# Patient Record
Sex: Male | Born: 1945 | Race: White | Hispanic: No | Marital: Married | Smoking: Never smoker
Health system: Southern US, Community
[De-identification: ages and names within clinical notes are randomized; demographics above are authoritative.]

## PROBLEM LIST (undated history)

## (undated) DIAGNOSIS — K635 Polyp of colon: Secondary | ICD-10-CM

## (undated) DIAGNOSIS — T7840XA Allergy, unspecified, initial encounter: Secondary | ICD-10-CM

## (undated) DIAGNOSIS — F419 Anxiety disorder, unspecified: Secondary | ICD-10-CM

## (undated) DIAGNOSIS — K5790 Diverticulosis of intestine, part unspecified, without perforation or abscess without bleeding: Secondary | ICD-10-CM

## (undated) DIAGNOSIS — K222 Esophageal obstruction: Secondary | ICD-10-CM

## (undated) DIAGNOSIS — K219 Gastro-esophageal reflux disease without esophagitis: Secondary | ICD-10-CM

## (undated) DIAGNOSIS — E785 Hyperlipidemia, unspecified: Secondary | ICD-10-CM

## (undated) DIAGNOSIS — K2210 Ulcer of esophagus without bleeding: Secondary | ICD-10-CM

## (undated) DIAGNOSIS — K589 Irritable bowel syndrome without diarrhea: Secondary | ICD-10-CM

## (undated) HISTORY — DX: Ulcer of esophagus without bleeding: K22.10

## (undated) HISTORY — DX: Anxiety disorder, unspecified: F41.9

## (undated) HISTORY — DX: Polyp of colon: K63.5

## (undated) HISTORY — DX: Gastro-esophageal reflux disease without esophagitis: K21.9

## (undated) HISTORY — PX: HERNIA REPAIR: SHX51

## (undated) HISTORY — DX: Diverticulosis of intestine, part unspecified, without perforation or abscess without bleeding: K57.90

## (undated) HISTORY — DX: Allergy, unspecified, initial encounter: T78.40XA

## (undated) HISTORY — DX: Hyperlipidemia, unspecified: E78.5

## (undated) HISTORY — DX: Esophageal obstruction: K22.2

## (undated) HISTORY — DX: Irritable bowel syndrome, unspecified: K58.9

---
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
## 1994-11-19 ENCOUNTER — Encounter: Payer: Self-pay | Admitting: Internal Medicine

## 1994-12-14 ENCOUNTER — Encounter: Payer: Self-pay | Admitting: Internal Medicine

## 1995-02-08 ENCOUNTER — Encounter: Payer: Self-pay | Admitting: Internal Medicine

## 1995-07-14 ENCOUNTER — Encounter: Payer: Self-pay | Admitting: Internal Medicine

## 1999-06-17 ENCOUNTER — Encounter: Payer: Self-pay | Admitting: Internal Medicine

## 1999-07-07 ENCOUNTER — Encounter: Payer: Self-pay | Admitting: Internal Medicine

## 1999-07-07 ENCOUNTER — Other Ambulatory Visit: Admission: RE | Admit: 1999-07-07 | Discharge: 1999-07-07 | Payer: Self-pay | Admitting: Internal Medicine

## 2001-03-10 ENCOUNTER — Emergency Department (HOSPITAL_COMMUNITY): Admission: EM | Admit: 2001-03-10 | Discharge: 2001-03-10 | Payer: Self-pay | Admitting: Emergency Medicine

## 2001-03-10 ENCOUNTER — Encounter: Payer: Self-pay | Admitting: Emergency Medicine

## 2002-07-17 ENCOUNTER — Encounter: Payer: Self-pay | Admitting: Internal Medicine

## 2003-12-28 ENCOUNTER — Ambulatory Visit: Payer: Self-pay | Admitting: Family Medicine

## 2004-01-04 ENCOUNTER — Ambulatory Visit: Payer: Self-pay | Admitting: Family Medicine

## 2004-12-19 ENCOUNTER — Ambulatory Visit: Payer: Self-pay | Admitting: Family Medicine

## 2004-12-26 ENCOUNTER — Ambulatory Visit: Payer: Self-pay | Admitting: Family Medicine

## 2005-07-13 ENCOUNTER — Ambulatory Visit: Payer: Self-pay | Admitting: Family Medicine

## 2005-12-25 ENCOUNTER — Ambulatory Visit: Payer: Self-pay | Admitting: Family Medicine

## 2005-12-25 LAB — CONVERTED CEMR LAB
ALT: 32 units/L (ref 0–40)
AST: 30 units/L (ref 0–37)
Albumin: 4.2 g/dL (ref 3.5–5.2)
Alkaline Phosphatase: 59 units/L (ref 39–117)
BUN: 14 mg/dL (ref 6–23)
Basophils Absolute: 0 10*3/uL (ref 0.0–0.1)
Basophils Relative: 0.1 % (ref 0.0–1.0)
CO2: 32 meq/L (ref 19–32)
Calcium: 9.9 mg/dL (ref 8.4–10.5)
Chloride: 101 meq/L (ref 96–112)
Chol/HDL Ratio, serum: 4.1
Cholesterol: 168 mg/dL (ref 0–200)
Creatinine, Ser: 1.1 mg/dL (ref 0.4–1.5)
Eosinophil percent: 1.5 % (ref 0.0–5.0)
GFR calc non Af Amer: 73 mL/min
Glomerular Filtration Rate, Af Am: 88 mL/min/{1.73_m2}
Glucose, Bld: 87 mg/dL (ref 70–99)
HCT: 47.1 % (ref 39.0–52.0)
HDL: 40.9 mg/dL (ref >39.0)
Hemoglobin: 15.7 g/dL (ref 13.0–17.0)
LDL Cholesterol: 115 mg/dL — ABNORMAL HIGH (ref 0–99)
Lymphocytes Relative: 27.6 % (ref 12.0–46.0)
MCHC: 33.4 g/dL (ref 30.0–36.0)
MCV: 94 fL (ref 78.0–100.0)
Monocytes Absolute: 0.4 10*3/uL (ref 0.2–0.7)
Monocytes Relative: 8.8 % (ref 3.0–11.0)
Neutro Abs: 3.2 10*3/uL (ref 1.4–7.7)
Neutrophils Relative %: 62 % (ref 43.0–77.0)
PSA: 1.18 ng/mL (ref 0.10–4.00)
Platelets: 219 10*3/uL (ref 150–400)
Potassium: 4.1 meq/L (ref 3.5–5.1)
RBC: 5.01 M/uL (ref 4.22–5.81)
RDW: 12 % (ref 11.5–14.6)
Sodium: 139 meq/L (ref 135–145)
TSH: 2.37 microintl units/mL (ref 0.35–5.50)
Total Bilirubin: 1.1 mg/dL (ref 0.3–1.2)
Total Protein: 7.4 g/dL (ref 6.0–8.3)
Triglyceride fasting, serum: 59 mg/dL (ref 0–149)
VLDL: 12 mg/dL (ref 0–40)
WBC: 5.1 10*3/uL (ref 4.5–10.5)

## 2006-01-01 ENCOUNTER — Ambulatory Visit: Payer: Self-pay | Admitting: Family Medicine

## 2006-12-24 ENCOUNTER — Telehealth: Payer: Self-pay | Admitting: Family Medicine

## 2006-12-30 ENCOUNTER — Ambulatory Visit: Payer: Self-pay | Admitting: Family Medicine

## 2006-12-30 LAB — CONVERTED CEMR LAB
BUN: 16 mg/dL (ref 6–23)
Basophils Relative: 0 % (ref 0.0–1.0)
Bilirubin Urine: NEGATIVE
Bilirubin, Direct: 0.2 mg/dL (ref 0.0–0.3)
Blood in Urine, dipstick: NEGATIVE
CO2: 31 meq/L (ref 19–32)
Cholesterol: 163 mg/dL (ref 0–200)
GFR calc Af Amer: 98 mL/min
Glucose, Bld: 83 mg/dL (ref 70–99)
Glucose, Urine, Semiquant: NEGATIVE
HDL: 35.2 mg/dL — ABNORMAL LOW (ref >39.0)
Hemoglobin: 15.1 g/dL (ref 13.0–17.0)
Ketones, urine, test strip: NEGATIVE
Lymphocytes Relative: 24.7 % (ref 12.0–46.0)
Monocytes Absolute: 0.4 10*3/uL (ref 0.2–0.7)
Monocytes Relative: 8.1 % (ref 3.0–11.0)
Neutro Abs: 3.6 10*3/uL (ref 1.4–7.7)
Nitrite: NEGATIVE
Potassium: 4.2 meq/L (ref 3.5–5.1)
Protein, U semiquant: NEGATIVE
Specific Gravity, Urine: 1.02
TSH: 2.18 microintl units/mL (ref 0.35–5.50)
Total Protein: 6.7 g/dL (ref 6.0–8.3)
Urobilinogen, UA: 0.2
VLDL: 18 mg/dL (ref 0–40)
WBC Urine, dipstick: NEGATIVE
pH: 5.5

## 2007-01-06 ENCOUNTER — Ambulatory Visit: Payer: Self-pay | Admitting: Family Medicine

## 2007-01-06 DIAGNOSIS — J309 Allergic rhinitis, unspecified: Secondary | ICD-10-CM | POA: Insufficient documentation

## 2007-01-06 DIAGNOSIS — E785 Hyperlipidemia, unspecified: Secondary | ICD-10-CM | POA: Insufficient documentation

## 2007-01-06 DIAGNOSIS — K219 Gastro-esophageal reflux disease without esophagitis: Secondary | ICD-10-CM | POA: Insufficient documentation

## 2007-02-22 ENCOUNTER — Ambulatory Visit: Payer: Self-pay | Admitting: Family Medicine

## 2007-06-17 ENCOUNTER — Ambulatory Visit: Payer: Self-pay | Admitting: Internal Medicine

## 2007-07-01 ENCOUNTER — Ambulatory Visit: Payer: Self-pay | Admitting: Internal Medicine

## 2007-07-01 LAB — HM COLONOSCOPY

## 2007-09-21 ENCOUNTER — Telehealth: Payer: Self-pay | Admitting: Family Medicine

## 2007-11-08 ENCOUNTER — Encounter: Admission: RE | Admit: 2007-11-08 | Discharge: 2007-11-08 | Payer: Self-pay | Admitting: Sports Medicine

## 2007-11-11 ENCOUNTER — Encounter: Admission: RE | Admit: 2007-11-11 | Discharge: 2007-11-11 | Payer: Self-pay | Admitting: Sports Medicine

## 2007-12-05 ENCOUNTER — Telehealth: Payer: Self-pay | Admitting: Internal Medicine

## 2007-12-06 ENCOUNTER — Ambulatory Visit: Payer: Self-pay | Admitting: Gastroenterology

## 2007-12-06 DIAGNOSIS — R1033 Periumbilical pain: Secondary | ICD-10-CM | POA: Insufficient documentation

## 2007-12-06 DIAGNOSIS — K5730 Diverticulosis of large intestine without perforation or abscess without bleeding: Secondary | ICD-10-CM | POA: Insufficient documentation

## 2007-12-06 DIAGNOSIS — R198 Other specified symptoms and signs involving the digestive system and abdomen: Secondary | ICD-10-CM | POA: Insufficient documentation

## 2007-12-13 ENCOUNTER — Telehealth: Payer: Self-pay | Admitting: Internal Medicine

## 2007-12-29 ENCOUNTER — Ambulatory Visit: Payer: Self-pay | Admitting: Family Medicine

## 2007-12-29 LAB — CONVERTED CEMR LAB
AST: 34 units/L (ref 0–37)
Albumin: 4 g/dL (ref 3.5–5.2)
BUN: 14 mg/dL (ref 6–23)
Basophils Relative: 0 % (ref 0.0–3.0)
Chloride: 105 meq/L (ref 96–112)
Creatinine, Ser: 1 mg/dL (ref 0.4–1.5)
Eosinophils Absolute: 0.1 10*3/uL (ref 0.0–0.7)
Eosinophils Relative: 1.6 % (ref 0.0–5.0)
GFR calc non Af Amer: 81 mL/min
Glucose, Bld: 89 mg/dL (ref 70–99)
Glucose, Urine, Semiquant: NEGATIVE
HCT: 42.9 % (ref 39.0–52.0)
MCV: 94.4 fL (ref 78.0–100.0)
Monocytes Absolute: 0.7 10*3/uL (ref 0.1–1.0)
Neutrophils Relative %: 64.9 % (ref 43.0–77.0)
PSA: 1.27 ng/mL (ref 0.10–4.00)
RBC: 4.55 M/uL (ref 4.22–5.81)
Specific Gravity, Urine: 1.015
Total Protein: 7.2 g/dL (ref 6.0–8.3)
WBC Urine, dipstick: NEGATIVE
WBC: 5.7 10*3/uL (ref 4.5–10.5)
pH: 6

## 2008-01-04 ENCOUNTER — Ambulatory Visit: Payer: Self-pay | Admitting: Internal Medicine

## 2008-01-04 DIAGNOSIS — K5732 Diverticulitis of large intestine without perforation or abscess without bleeding: Secondary | ICD-10-CM | POA: Insufficient documentation

## 2008-01-05 ENCOUNTER — Encounter: Payer: Self-pay | Admitting: Family Medicine

## 2008-01-06 ENCOUNTER — Ambulatory Visit: Payer: Self-pay | Admitting: Family Medicine

## 2008-01-06 DIAGNOSIS — J0100 Acute maxillary sinusitis, unspecified: Secondary | ICD-10-CM | POA: Insufficient documentation

## 2008-01-06 DIAGNOSIS — F528 Other sexual dysfunction not due to a substance or known physiological condition: Secondary | ICD-10-CM | POA: Insufficient documentation

## 2008-03-13 ENCOUNTER — Telehealth: Payer: Self-pay | Admitting: *Deleted

## 2008-03-15 ENCOUNTER — Telehealth: Payer: Self-pay | Admitting: Family Medicine

## 2008-05-24 DIAGNOSIS — R1011 Right upper quadrant pain: Secondary | ICD-10-CM | POA: Insufficient documentation

## 2008-05-25 ENCOUNTER — Ambulatory Visit: Payer: Self-pay | Admitting: Family Medicine

## 2008-05-25 LAB — CONVERTED CEMR LAB
Alkaline Phosphatase: 64 units/L (ref 39–117)
Basophils Absolute: 0 10*3/uL (ref 0.0–0.1)
Basophils Relative: 0 % (ref 0–1)
Calcium: 9 mg/dL (ref 8.4–10.5)
Creatinine, Ser: 0.93 mg/dL (ref 0.40–1.50)
Eosinophils Absolute: 0 10*3/uL (ref 0.0–0.7)
Indirect Bilirubin: 0.8 mg/dL (ref 0.0–0.9)
Lipase: 14 units/L (ref 0–75)
MCHC: 33.1 g/dL (ref 30.0–36.0)
Monocytes Relative: 11 % (ref 3–12)
Neutro Abs: 4.3 10*3/uL (ref 1.7–7.7)
Neutrophils Relative %: 76 % (ref 43–77)
Platelets: 202 10*3/uL (ref 150–400)
RBC: 5.13 M/uL (ref 4.22–5.81)
RDW: 12.9 % (ref 11.5–15.5)
Sodium: 141 meq/L (ref 135–145)
Total Bilirubin: 1 mg/dL (ref 0.3–1.2)

## 2008-05-28 ENCOUNTER — Encounter
Admission: RE | Admit: 2008-05-28 | Discharge: 2008-05-28 | Payer: Self-pay | Admitting: Certified Registered Nurse Anesthetist

## 2008-05-29 ENCOUNTER — Ambulatory Visit: Payer: Self-pay | Admitting: Family Medicine

## 2008-06-06 ENCOUNTER — Telehealth: Payer: Self-pay | Admitting: Family Medicine

## 2008-06-12 ENCOUNTER — Ambulatory Visit (HOSPITAL_COMMUNITY): Admission: RE | Admit: 2008-06-12 | Discharge: 2008-06-12 | Payer: Self-pay | Admitting: Family Medicine

## 2008-06-12 ENCOUNTER — Telehealth: Payer: Self-pay | Admitting: Family Medicine

## 2008-07-14 DIAGNOSIS — J069 Acute upper respiratory infection, unspecified: Secondary | ICD-10-CM | POA: Insufficient documentation

## 2008-07-17 ENCOUNTER — Ambulatory Visit: Payer: Self-pay | Admitting: Family Medicine

## 2008-08-02 ENCOUNTER — Ambulatory Visit: Payer: Self-pay | Admitting: Family Medicine

## 2008-08-02 LAB — CONVERTED CEMR LAB
Cholesterol: 186 mg/dL (ref 0–200)
LDL Cholesterol: 125 mg/dL — ABNORMAL HIGH (ref 0–99)
Testosterone: 343.91 ng/dL — ABNORMAL LOW (ref 350.00–890.00)
Triglycerides: 125 mg/dL (ref 0.0–149.0)
VLDL: 25 mg/dL (ref 0.0–40.0)

## 2008-08-07 ENCOUNTER — Telehealth: Payer: Self-pay | Admitting: Family Medicine

## 2008-10-10 ENCOUNTER — Telehealth: Payer: Self-pay | Admitting: Family Medicine

## 2009-01-02 ENCOUNTER — Ambulatory Visit: Payer: Self-pay | Admitting: Family Medicine

## 2009-01-02 LAB — CONVERTED CEMR LAB
ALT: 23 units/L (ref 0–53)
AST: 25 units/L (ref 0–37)
Alkaline Phosphatase: 54 units/L (ref 39–117)
Basophils Absolute: 0 10*3/uL (ref 0.0–0.1)
Bilirubin, Direct: 0 mg/dL (ref 0.0–0.3)
Blood in Urine, dipstick: NEGATIVE
CO2: 30 meq/L (ref 19–32)
Chloride: 105 meq/L (ref 96–112)
Cholesterol: 214 mg/dL — ABNORMAL HIGH (ref 0–200)
Creatinine, Ser: 1 mg/dL (ref 0.4–1.5)
Direct LDL: 161.4 mg/dL
Eosinophils Absolute: 0.1 10*3/uL (ref 0.0–0.7)
Glucose, Urine, Semiquant: NEGATIVE
Ketones, urine, test strip: NEGATIVE
Lymphocytes Relative: 24.2 % (ref 12.0–46.0)
MCHC: 34.2 g/dL (ref 30.0–36.0)
Neutrophils Relative %: 66.9 % (ref 43.0–77.0)
Nitrite: NEGATIVE
PSA: 1.62 ng/mL (ref 0.10–4.00)
Platelets: 218 10*3/uL (ref 150.0–400.0)
RDW: 12 % (ref 11.5–14.6)
Sodium: 141 meq/L (ref 135–145)
Total CHOL/HDL Ratio: 5
Total Protein: 7.4 g/dL (ref 6.0–8.3)
WBC Urine, dipstick: NEGATIVE

## 2009-01-08 ENCOUNTER — Ambulatory Visit: Payer: Self-pay | Admitting: Family Medicine

## 2009-02-14 ENCOUNTER — Telehealth: Payer: Self-pay | Admitting: Family Medicine

## 2009-06-06 ENCOUNTER — Telehealth: Payer: Self-pay | Admitting: Internal Medicine

## 2009-07-08 ENCOUNTER — Telehealth: Payer: Self-pay | Admitting: Family Medicine

## 2009-07-17 ENCOUNTER — Ambulatory Visit: Payer: Self-pay | Admitting: Internal Medicine

## 2009-07-17 DIAGNOSIS — K222 Esophageal obstruction: Secondary | ICD-10-CM

## 2009-07-17 DIAGNOSIS — Z860100 Personal history of colon polyps, unspecified: Secondary | ICD-10-CM | POA: Insufficient documentation

## 2009-09-26 ENCOUNTER — Telehealth: Payer: Self-pay | Admitting: Family Medicine

## 2009-10-07 ENCOUNTER — Telehealth: Payer: Self-pay | Admitting: Family Medicine

## 2009-12-25 IMAGING — NM NM HEPATO W/GB/PHARM/[PERSON_NAME]
2 series · 12 of 12 positions shown · non-contrast
Comparison: None.

CLINICAL DATA: Right upper quadrant pain.

NUCLEAR MEDICINE HEPATOBILIARY IMAGING WITH GALLBLADDER EF
TECHNIQUE: Sequential images of the abdomen were obtained [DATE]
minutes following intravenous administration of
radiopharmaceutical.  After slow intravenous infusion of 1.5 uCg
Cholecystokinin, gallbladder ejection fraction was determined.
Radiopharmaceutical:  5.0 mCi Zc-MMm Choletec

[Series 1: he hepato · 4.75mm/px · 6 of 60 frames shown (1 of 2)]
[frame 6/60]
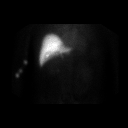
[frame 16/60]
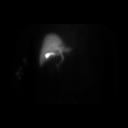
[frame 26/60]
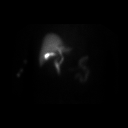
[frame 36/60]
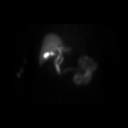
[frame 46/60]
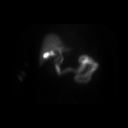
[frame 56/60]
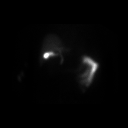

[Series 1: he hepato · 4.75mm/px · 6 of 30 frames shown (2 of 2)]
[frame 3/30]
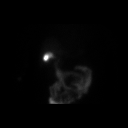
[frame 8/30]
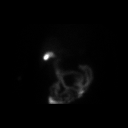
[frame 13/30]
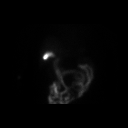
[frame 18/30]
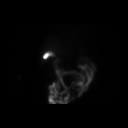
[frame 23/30]
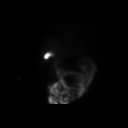
[frame 28/30]
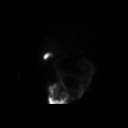

[12 of 12 positions shown; findings below may reference images not displayed]

FINDINGS: Homogeneous uptake by the liver.  Gallbladder visualized
by 9 minutes.  Small bowel visualized by 22 minutes. After
injection of CCK, calculated ejection fraction at 30 minutes is
41.8% which is within range normal limits (greater 30%).

The patient did not experience symptoms during CCK infusion.
IMPRESSION: Normal gallbladder ejection fraction.

## 2010-01-02 ENCOUNTER — Ambulatory Visit: Payer: Self-pay | Admitting: Family Medicine

## 2010-01-02 LAB — CONVERTED CEMR LAB
ALT: 28 units/L (ref 0–53)
AST: 27 units/L (ref 0–37)
Basophils Relative: 0.2 % (ref 0.0–3.0)
Chloride: 103 meq/L (ref 96–112)
Eosinophils Relative: 1.4 % (ref 0.0–5.0)
GFR calc non Af Amer: 67.53 mL/min (ref >60.00)
Glucose, Urine, Semiquant: NEGATIVE
HCT: 42.9 % (ref 39.0–52.0)
HDL: 38.4 mg/dL — ABNORMAL LOW (ref >39.00)
Hemoglobin: 14.7 g/dL (ref 13.0–17.0)
Lymphs Abs: 1.2 10*3/uL (ref 0.7–4.0)
MCV: 95.1 fL (ref 78.0–100.0)
Monocytes Absolute: 0.4 10*3/uL (ref 0.1–1.0)
Monocytes Relative: 7.5 % (ref 3.0–12.0)
Nitrite: NEGATIVE
PSA: 1.4 ng/mL (ref 0.10–4.00)
Potassium: 4.1 meq/L (ref 3.5–5.1)
Protein, U semiquant: NEGATIVE
RBC: 4.51 M/uL (ref 4.22–5.81)
Sodium: 140 meq/L (ref 135–145)
TSH: 2.22 microintl units/mL (ref 0.35–5.50)
Total Bilirubin: 0.8 mg/dL (ref 0.3–1.2)
Total CHOL/HDL Ratio: 6
Total Protein: 6.5 g/dL (ref 6.0–8.3)
VLDL: 18.8 mg/dL (ref 0.0–40.0)
WBC Urine, dipstick: NEGATIVE
WBC: 5 10*3/uL (ref 4.5–10.5)
pH: 6.5

## 2010-01-09 ENCOUNTER — Ambulatory Visit: Payer: Self-pay | Admitting: Family Medicine

## 2010-02-11 ENCOUNTER — Encounter: Payer: Self-pay | Admitting: Family Medicine

## 2010-02-18 NOTE — Procedures (Signed)
Summary: [employer]   Imported By: Sherian Rein 07/17/2009 13:27:33  _____________________________________________________________________  External Attachment:    Type:   Image     Comment:   External Document

## 2010-02-18 NOTE — Progress Notes (Signed)
Summary: Pt req phone consult re: Omeprazole  Phone Note Call from Patient Call back at [phone]    Caller: Patient Summary of Call: Pt called and req a short phone consult with Dr. Tawanna Cooler re: Omeprazole. Initial call taken by: Lucy Antigua,  February 14, 2009 10:58 AM  Follow-up for Phone Call        Fleet Contras please call find out what the issue is Follow-up by: Roderick Pee MD,  February 14, 2009 12:53 PM  Additional Follow-up for Phone Call Additional follow up Details #1::        left message on machine  Additional Follow-up by: Kern Reap CMA Duncan Dull),  February 15, 2009 2:28 PM    Additional Follow-up for Phone Call Additional follow up Details #2::    patient would like to know if he can just stop is omeprazole or should he take one every other day and tapper off Follow-up by: Kern Reap CMA Duncan Dull),  February 15, 2009 4:20 PM  Additional Follow-up for Phone Call Additional follow up Details #3:: Details for Additional Follow-up Action Taken: ok to stop. patient aware Additional Follow-up by: Roderick Pee MD,  February 15, 2009 5:07 PM

## 2010-02-18 NOTE — Progress Notes (Signed)
Summary: Refill Omeprazole, Clalis  Phone Note Refill Request Message from:  Pharmacy on September 26, 2009 11:24 AM  Refills Requested: Medication #1:  CIALIS 10 MG TABS UAD.   Dosage confirmed as above?Dosage Confirmed Initial call taken by: Kathrynn Speed CMA,  September 26, 2009 11:24 AM    Prescriptions: OMEPRAZOLE 20 MG  CPDR (OMEPRAZOLE) Take 1 tablet by mouth once a day  #90 x 1   Entered by:   Kathrynn Speed CMA   Authorized by:   Roderick Pee MD   Signed by:   Kathrynn Speed CMA on 09/26/2009   Method used:   Faxed to ...       [facility] (mail-order)             , [place]         Ph: [phone]       Fax: [phone]   RxID:   [id number] CIALIS 10 MG TABS (TADALAFIL) UAD  #6 x 4   Entered by:   Kathrynn Speed CMA   Authorized by:   Roderick Pee MD   Signed by:   Kathrynn Speed CMA on 09/26/2009   Method used:   Faxed to ...       [facility] (mail-order)             , [place]         Ph: [phone]       Fax: [phone]   RxID:   [id number]

## 2010-02-18 NOTE — Procedures (Signed)
Summary: EGD/[facility]  EGD/[facility]   Imported By: Sherian Rein 07/17/2009 13:22:55  _____________________________________________________________________  External Attachment:    Type:   Image     Comment:   External Document

## 2010-02-18 NOTE — Procedures (Signed)
Summary: Flexible Sigmoidoscopy/[facility]  Flexible Sigmoidoscopy/ HealthCare   Imported By: Sherian Rein 07/17/2009 13:32:46  _____________________________________________________________________  External Attachment:    Type:   Image     Comment:   External Document

## 2010-02-18 NOTE — Progress Notes (Signed)
Summary: Surgical consult input  Phone Note Call from Patient Call back at Work Phone [phone] Call back at [phone]   Call For: Dr [name] Summary of Call: Wants your input and Dr Lamar Sprinkles on a surgical consult. Initial call taken by: Leanor Kail [facility],  Jun 06, 2009 12:48 PM  Follow-up for Phone Call        Pt. has decreased his Omeprazole and is down to 2 a week.Asking if Dr.Perry is familiar with the new T.I.F. procedure and  would he be a candidate for it.It is being done at [facility].  Follow-up by: Teryl Lucy RN,  Jun 06, 2009 3:04 PM  Additional Follow-up for Phone Call Additional follow up Details #1::        not sure what he's referring to. If it is possible GERD therapy, he should see me in the office to discuss Additional Follow-up by: Hilarie Fredrickson MD,  Jun 07, 2009 9:32 AM    Additional Follow-up for Phone Call Additional follow up Details #2::    Message left on pt's work phone re:Dr.Perry's recommendations. Follow-up by: Teryl Lucy RN,  Jun 07, 2009 10:06 AM

## 2010-02-18 NOTE — Progress Notes (Signed)
Summary: Med clarification  Phone Note Refill Request Message from:  Fax from Pharmacy on September 26, 2009 4:18 PM  Refills Requested: Medication #1:  CIALIS 10 MG TABS UAD. [facility] is requesting prescription clarification     New/Updated Medications: CIALIS 10 MG TABS (TADALAFIL) Use as directed (as needed) Prescriptions: CIALIS 10 MG TABS (TADALAFIL) Use as directed (as needed)  #6 x 4   Entered by:   Lucious Groves CMA   Authorized by:   Roderick Pee MD   Signed by:   Lucious Groves CMA on 09/26/2009   Method used:   Faxed to ...       [facility] (mail-order)             , [place]         Ph: [phone]       Fax: [phone]   RxID:   [id number]

## 2010-02-18 NOTE — Procedures (Signed)
Summary: EGD/[facility]  EGD/[facility]   Imported By: Sherian Rein 07/17/2009 13:25:09  _____________________________________________________________________  External Attachment:    Type:   Image     Comment:   External Document

## 2010-02-18 NOTE — Procedures (Signed)
Summary: Colonoscopy/[facility]  Colonoscopy/[facility]   Imported By: Sherian Rein 07/17/2009 13:29:16  _____________________________________________________________________  External Attachment:    Type:   Image     Comment:   External Document

## 2010-02-18 NOTE — Progress Notes (Signed)
[facility]   Imported By: Sherian Rein 07/17/2009 13:35:36  _____________________________________________________________________  External Attachment:    Type:   Image     Comment:   External Document

## 2010-02-18 NOTE — Progress Notes (Signed)
Summary: [facility]   Imported By: Sherian Rein 07/17/2009 13:34:03  _____________________________________________________________________  External Attachment:    Type:   Image     Comment:   External Document

## 2010-02-18 NOTE — Assessment & Plan Note (Signed)
Summary: Followup GERD   History of Present Illness Visit Type: Follow-up Visit Primary GI MD: Yancey Flemings MD Primary Provider: Kelle Darting, MD Requesting Provider: na Chief Complaint: F/u GERD. Pt states that he is doing better and denies any GI complaints  History of Present Illness:   65 year old white male with hyperlipidemia, adenomatous colon polyps, GERD complicated by erosive esophagitis and peptic stricture that required dilation, and diverticulosis. He presents today regarding ongoing management of GERD. Also questions regarding an endoscopic mechanical procedure called TIF for GERD. The patient tried to wean himself off omeprazole earlier this year. He was able to get down to 2 pills per week. However, over time, significant recurrent symptoms generally with any dietary indiscretion. He is now back on once daily PPI. Overall general health remains well. He did want to discuss issues regarding long-term use of PPI. Currently, he denies dysphagia, weight loss, or melena. His last surveillance colonoscopy in 2009 was negative except for diverticulosis.   GI Review of Systems    Reports acid reflux.      Denies abdominal pain, belching, bloating, chest pain, dysphagia with liquids, dysphagia with solids, heartburn, loss of appetite, nausea, vomiting, vomiting blood, weight loss, and  weight gain.      Reports diverticulosis.     Denies anal fissure, black tarry stools, change in bowel habit, constipation, diarrhea, fecal incontinence, heme positive stool, hemorrhoids, irritable bowel syndrome, jaundice, light color stool, liver problems, rectal bleeding, and  rectal pain.    Current Medications (verified): 1)  Omeprazole 20 Mg  Cpdr (Omeprazole) .... Take 1 Tablet By Mouth Once A Day 2)  Cialis 10 Mg Tabs (Tadalafil) .... Uad  Allergies (verified): No Known Drug Allergies  Past History:  Past Medical History: Reviewed history from 12/06/2007 and no changes required. Allergic  rhinitis GERD Hyperlipidemia Hx of colon polyps Erosive Esophagitis Diverticulosis  Past Surgical History: Reviewed history from 12/06/2007 and no changes required. Tonsillectomy  Family History: father died at age 64, MI.  He was obese, in a diabetic.  Mother died 77, old age 43 brother died of a motor vehicle accident.  Two sisters with coronary artery disease and obesity. father and sister, gallbladder disease No FH of Colon Cancer:  Social History: Scientist, physiological  Married Never Smoked Alcohol use-no Drug use-no Regular exercise-yes  Review of Systems  The patient denies allergy/sinus, anemia, anxiety-new, arthritis/joint pain, back pain, blood in urine, breast changes/lumps, change in vision, confusion, cough, coughing up blood, depression-new, fainting, fatigue, fever, headaches-new, hearing problems, heart murmur, heart rhythm changes, itching, muscle pains/cramps, night sweats, nosebleeds, shortness of breath, skin rash, sleeping problems, sore throat, swelling of feet/legs, swollen lymph glands, thirst - excessive, urination - excessive, urination changes/pain, urine leakage, vision changes, and voice change.    Vital Signs:  Patient profile:   65 year old male Height:      68 inches Weight:      169 pounds BMI:     25.79 BSA:     1.90 Pulse rate:   64 / minute Pulse rhythm:   regular BP sitting:   122 / 74  (left arm) Cuff size:   regular  Vitals Entered By: Ok Anis CMA (July 17, 2009 8:22 AM)  Physical Exam  General:  Well developed, well nourished, no acute distress. Head:  Normocephalic and atraumatic. Eyes:  PERRLA, no icterus. Nose:  No deformity, discharge,  or lesions. Mouth:  No deformity or lesions, dentition normal. Neck:  Supple; no masses or thyromegaly.  Lungs:  Clear throughout to auscultation. Heart:  Regular rate and rhythm; no murmurs, rubs,  or bruits. Abdomen:  Soft, nontender and nondistended. No masses, hepatosplenomegaly or  hernias noted. Normal bowel sounds. Msk:  Symmetrical with no gross deformities. Normal posture. Pulses:  Normal pulses noted. Extremities:  No  edema or deformities noted. Neurologic:  Alert and  oriented x4 Skin:  Intact without significant lesions or rashes. Cervical Nodes:  No significant cervical adenopathy.no supraclavicular adenopathy Psych:  Alert and cooperative. Normal mood and affect.   Impression & Recommendations:  Problem # 1:  GERD (ICD-74.18) 65 year old with long-standing GERD. GERD complicated by endoscopically documented erosive esophagitis and symptomatic peptic stricture that required esophageal dilation. First, we discussed the risk benefit ratio of PPI therapy versus other medical versus mechanical (surgical/nonsurgical) therapies. We discussed potential concerns regarding PPI/drug interactions, decreased bone density issues, hypomagnesemia, and increased risk of certain infections such as C. differential. Also discussed the limited experience with TIF and could not advocate a procedure at this point in time. I also emphasized the significance of his reflux disease with both erosive esophagitis and prior stricture formation. Parenthetically, no Barrett's on previous endoscopies. At the end of the discussion(20+ minutes) it was decided to continue on once daily omeprazole, continued adherence to reflux precautions, keep abreast of current developments in the reflux field as we do, and routine follow up in 2 years. Sooner for interval questions or problems.  Problem # 2:  ESOPHAGEAL STRICTURE (ICD-530.3) prior stricture requiring dilation. Currently asymptomatic post dilation on PPI. Repeat endoscopy p.r.n. recurrent dysphagia  Problem # 3:  PERSONAL HX COLONIC POLYPS (ICD-V12.72) history of adenomatous colon polyps. Last exam in 2009 negative for neoplasia. Routine followup planned for 2014.  Problem # 4:  DIVERTICULOSIS, COLON (ICD-562.10) Assessment: Comment  Only  Patient Instructions: 1)  Follow-up in 2 years and sooner if needed.   2)  The medication list was reviewed and reconciled.  All changed / newly prescribed medications were explained.  A complete medication list was provided to the patient / caregiver.

## 2010-02-18 NOTE — Progress Notes (Signed)
Summary: omeprazole  Phone Note Call from Patient Call back at [facility] Phone [phone]   Summary of Call: [facility] faxed request to renew Omeprazole.  They have not gotten response.  Please do.   Initial call taken by: Rudy Jew, RN,  October 07, 2009 9:48 AM  Follow-up for Phone Call        Sonia please call patient Follow-up by: Roderick Pee MD,  October 07, 2009 9:50 AM  Additional Follow-up for Phone Call Additional follow up Details #1::        Called pt told him that already faxed refill for omeprazole told pt to call [facility] should heve refill on file. Additional Follow-up by: Kathrynn Speed CMA,  October 07, 2009 10:18 AM     Appended Document: omeprazole Pt called back said [facility] lost his Omeprazole rx. I called in refill to [facility] for refill of Omeprazole, 90 tabs with 1 refill.Marland KitchenMarland KitchenMarland KitchenPt is aware

## 2010-02-18 NOTE — Progress Notes (Signed)
Summary: refills  Phone Note Refill Request Message from:  Fax from Pharmacy on July 08, 2009 12:19 PM  Refills Requested: Medication #1:  OMEPRAZOLE 20 MG  CPDR Take 1 tablet by mouth once a day  Medication #2:  CIALIS 10 MG TABS UAD. Initial call taken by: Kern Reap CMA Duncan Dull),  July 08, 2009 12:19 PM    Prescriptions: CIALIS 10 MG TABS (TADALAFIL) UAD  #6 x 11   Entered by:   Kern Reap CMA (AAMA)   Authorized by:   Roderick Pee MD   Signed by:   Kern Reap CMA (AAMA) on 07/08/2009   Method used:   Electronically to        [facility]  [address] [facility] [phone][facility] (retail)       [address] [place], [place]  [address]       Ph: [phone] or [phone]       Fax: [phone]   RxID:   [id number] OMEPRAZOLE 20 MG  CPDR (OMEPRAZOLE) Take 1 tablet by mouth once a day  #100 x 1   Entered by:   Kern Reap CMA (AAMA)   Authorized by:   Roderick Pee MD   Signed by:   Kern Reap CMA (AAMA) on 07/08/2009   Method used:   [facility] to        [facility]  [address] [phone]* (retail)       [address] [place], [place]  [address]       Ph: [phone] or [phone]       Fax: [phone]   RxID:   [id number]

## 2010-02-20 NOTE — Assessment & Plan Note (Signed)
Summary: cpx/cjr/pt rsc from bmp/cjr   Vital Signs:  Patient profile:   65 year old male Height:      68 inches Weight:      168 pounds BMI:     25.64 Temp:     98 degrees F Pulse rate:   70 / minute Pulse rhythm:   regular BP sitting:   118 / 78  (right arm)  Primary Care Provider:  Kelle Darting, MD   History of Present Illness: Bryce Bond is a 65 year old, married male, nonsmoker, who comes in today for general physical examination  He's always been in excellent health.  He said no chronic health problems except for reflux esophagitis.  He's had an esophageal stricture.  Currently, swallowing normally.  He takes omeprazole 20 mg daily.  He also has mild erectile dysfunction for which he uses Cialis 10 mg p.r.n.  He also has a history of mild hyperlipidemia, treated with diet and exercise.  The test and normalized.  He continues to exercise on a regular basis 3 times a week.  He does spinning.  Tetanus 2002........Marland Kitchen booster today......... seasonal flu 2011 shingles 2009.  Allergies: No Known Drug Allergies  Past History:  Past medical, surgical, family and social histories (including risk factors) reviewed, and no changes noted (except as noted below).  Past Medical History: Reviewed history from 12/06/2007 and no changes required. Allergic rhinitis GERD Hyperlipidemia Hx of colon polyps Erosive Esophagitis Diverticulosis  Past Surgical History: Reviewed history from 12/06/2007 and no changes required. Tonsillectomy  Family History: Reviewed history from 07/17/2009 and no changes required. father died at age 45, MI.  He was obese, in a diabetic.  Mother died 24, old age 58 brother died of a motor vehicle accident.  Two sisters with coronary artery disease and obesity. father and sister, gallbladder disease No FH of Colon Cancer:  Social History: Reviewed history from 07/17/2009 and no changes required. Occupation:[employer]  Married Never Smoked Alcohol  use-no Drug use-no Regular exercise-yes  Review of Systems      See HPI  Physical Exam  General:  Well-developed,well-nourished,in no acute distress; alert,appropriate and cooperative throughout examination Head:  Normocephalic and atraumatic without obvious abnormalities. No apparent alopecia or balding. Eyes:  No corneal or conjunctival inflammation noted. EOMI. Perrla. Funduscopic exam benign, without hemorrhages, exudates or papilledema. Vision grossly normal. Ears:  External ear exam shows no significant lesions or deformities.  Otoscopic examination reveals clear canals, tympanic membranes are intact bilaterally without bulging, retraction, inflammation or discharge. Hearing is grossly normal bilaterally. Nose:  External nasal examination shows no deformity or inflammation. Nasal mucosa are pink and moist without lesions or exudates. Mouth:  Oral mucosa and oropharynx without lesions or exudates.  Teeth in good repair. Neck:  No deformities, masses, or tenderness noted. Chest Wall:  No deformities, masses, tenderness or gynecomastia noted. Breasts:  No masses or gynecomastia noted Lungs:  Normal respiratory effort, chest expands symmetrically. Lungs are clear to auscultation, no crackles or wheezes. Heart:  Normal rate and regular rhythm. S1 and S2 normal without gallop, murmur, click, rub or other extra sounds. Abdomen:  Bowel sounds positive,abdomen soft and non-tender without masses, organomegaly or hernias noted. Rectal:  No external abnormalities noted. Normal sphincter tone. No rectal masses or tenderness. Genitalia:  Testes bilaterally descended without nodularity, tenderness or masses. No scrotal masses or lesions. No penis lesions or urethral discharge. Prostate:  Prostate gland firm and smooth, no enlargement, nodularity, tenderness, mass, asymmetry or induration. Msk:  No deformity or  scoliosis noted of thoracic or lumbar spine.   Pulses:  R and L  carotid,radial,femoral,dorsalis pedis and posterior tibial pulses are full and equal bilaterally Extremities:  No clubbing, cyanosis, edema, or deformity noted with normal full range of motion of all joints.   Neurologic:  No cranial nerve deficits noted. Station and gait are normal. Plantar reflexes are down-going bilaterally. DTRs are symmetrical throughout. Sensory, motor and coordinative functions appear intact. Skin:  Intact without suspicious lesions or rashes Cervical Nodes:  No lymphadenopathy noted Axillary Nodes:  No palpable lymphadenopathy Inguinal Nodes:  No significant adenopathy Psych:  Cognition and judgment appear intact. Alert and cooperative with normal attention span and concentration. No apparent delusions, illusions, hallucinations   Impression & Recommendations:  Problem # 1:  ERECTILE DYSFUNCTION, MILD (ICD-302.72) Assessment Improved  His updated medication list for this problem includes:    Cialis 10 Mg Tabs (Tadalafil) ..... Use as directed (as needed)  Orders: Prescription Created Electronically [phone])  Problem # 2:  GERD (ICD-530.81) Assessment: Improved  His updated medication list for this problem includes:    Omeprazole 20 Mg Cpdr (Omeprazole) .Marland Kitchen... Take 1 tablet by mouth once a day  Orders: Prescription Created Electronically [phone])  Problem # 3:  PHYSICAL EXAMINATION (ICD-V70.0) Assessment: Unchanged  Orders: Prescription Created Electronically [phone])  Complete Medication List: 1)  Omeprazole 20 Mg Cpdr (Omeprazole) .... Take 1 tablet by mouth once a day 2)  Cialis 10 Mg Tabs (Tadalafil) .... Use as directed (as needed)  Patient Instructions: 1)  Please schedule a follow-up appointment in 1 year. 2)  Please schedule a follow-up appointment as needed. Prescriptions: CIALIS 10 MG TABS (TADALAFIL) Use as directed (as needed)  #6 x 4   Entered and Authorized by:   Roderick Pee MD   Signed by:   Roderick Pee MD on 02/12/2010   Method  used:   [facility]        [facility]  [address] [phone][facility] (retail[facility]       [address] [place], [place]  [address]       Ph: [phone] or [phone]       Fax: [phone]   RxID:   [id number] OMEPRAZOLE 20 MG  CPDR (OMEPRAZOLE) Take 1 tablet by mouth once a day  #100 x 3   Entered and Authorized by:   Roderick Pee MD   Signed by:   Roderick Pee MD on 02/12/2010   Method used:   [facility]        [facility]  [address] [facility] [phone][facility]       [address] [place], [place]  [address]       Ph: [phone] or [phone]       Fax: [phone]   RxID:   [id number]    Orders Added: 1)  Prescription Created Electronically [[id number]] 2)  Est. Patient 40-64 years [94854]  Appended Document: Orders Update    Clinical Lists Changes  Orders: Added new Service order of EKG w/ Interpretation (93000) - Signed Added new Service order of TD Toxoids IM 7 YR + (62703) - Signed Added new Service order of Admin 1st Vaccine (50093) - Signed Observations: Added new observation of TD BOOST VIS: 12/07/07 version given February 12, 2010. (02/12/2010 12:26) Added new observation of TD BOOSTERLO: [id number] (02/12/2010 12:26) Added new  observation of TD BOOST EXP: 11/08/2011 (02/12/2010 12:26) Added new observation of TD BOOSTERBY: Kern Reap CMA (AAMA) (02/12/2010 12:26) Added new observation of TD BOOSTERRT: IM (02/12/2010 12:26) Added new observation of TDBOOSTERDSE: 0.5 ml (02/12/2010 12:26) Added new observation of TD BOOSTERMF: GlaxoSmithKline (02/12/2010 12:26) Added new observation of TD BOOST SIT: left deltoid (02/12/2010 12:26) Added new observation of TD BOOSTER: Td (02/12/2010 12:26)       Immunizations Administered:  Tetanus Vaccine:    Vaccine Type: Td    Site: left deltoid    Mfr: GlaxoSmithKline    Dose: 0.5 ml    Route: IM    Given by: Kern Reap CMA (AAMA)    Exp. Date: 11/08/2011    Lot #: [id number]    VIS given:  12/07/07 version given February 12, 2010.    Physician counseled: yes

## 2010-02-20 NOTE — Miscellaneous (Signed)
Summary: vaccine update  Clinical Lists Changes  Observations: Added new observation of FLU VAX: Historical (10/19/2009 16:12)      Immunization History:  Influenza Immunization History:    Influenza:  historical (10/19/2009)

## 2010-02-21 NOTE — Consult Note (Signed)
[facility]   Imported By: Sherian Rein 07/17/2009 13:30:55  _____________________________________________________________________  External Attachment:    Type:   Image     Comment:   External Document

## 2010-03-13 ENCOUNTER — Other Ambulatory Visit: Payer: Self-pay | Admitting: Family Medicine

## 2010-06-06 NOTE — H&P (Signed)
[facility]  Patient:    Bryce Bond, Bryce Bond Visit Number: [id number] MRN: [id number]          Service Type: Attending:  Madolyn Frieze. Jens Som, M.D. [facility] Dictated by:   Madolyn Frieze. Jens Som, M.D. [facility] Adm. Date:  03/10/01                           History and Physical  HISTORY OF PRESENT ILLNESS:  Bryce Bond is a very pleasant 65 year old male with history of prior cardiac history, who presents for evaluation of chest pain.  The patient had a nuclear study in our office on February 04, 1998, that showed no evidence of ischemia and an ejection fraction of 65 year old.  He works out routinely without exertional chest pain.  This morning he awoke at approximately 3 a.m. with chest pain in the left breast area.  The pain was described as a sharp sensation lasting seconds.  There were no sustained symptoms, but the chest pain would recur.  The pain was not pleuritic or positional and not related to food.  There was associated diaphoresis, but there was no dyspnea noted.  There was mild nausea.  Because of these symptoms, the patient came to the emergency room.  Of note, he also described some tingling in his left triceps area and hand.  He denies any orthopnea or PND and there is no pedal edema.  Of note, he did travel to [place], [place], on a business trip.  There has been no hemoptysis or leg trauma.  PRESENT MEDICATIONS:  Prilosec and Claritin.  ALLERGIES:  He has no known drug allergies.  SOCIAL HISTORY:  He does not smoke and only rarely consumes alcohol.  FAMILY HISTORY:  Positive for coronary disease in two sisters, as well as his father.  PAST MEDICAL HISTORY:  There is no diabetes mellitus, hypertension, or hyperlipidemia.  He is status post tonsillectomy.  He has gastroesophageal reflux disease, but is otherwise unremarkable.  REVIEW OF SYSTEMS:  He denies any headaches, fevers, or chills.  There is no productive cough or hemoptysis.  There is no  dysphagia, odynophagia, melena, or hematochezia.  There is no dysuria or hematuria.  There are no rashes or seizure activity.  There is no claudication noted.  There is no orthopnea or PND.  The remaining systems are negative.  PHYSICAL EXAMINATION:  Blood pressure 135/81, pulse 70, respiratory rate 18.  GENERAL APPEARANCE:  He is well developed, well nourished, and in no acute distress.  SKIN:  Warm and dry.  HEENT:  Unremarkable with normal eyelids.  NECK:  Supple with a normal upstroke bilaterally.  There are no bruits noted. There is no jugular venous distention and no thyromegaly noted.  CHEST:  Clear to auscultation.  Normal expansion.  CARDIOVASCULAR:  Regular rate and rhythm with a normal S1 and S2.  There were no murmurs, rubs, or gallops noted.  ABDOMEN:  Nontender and nondistended.  Positive bowel sounds.  No hepatosplenomegaly.  No masses appreciated.  There is no abdominal bruit.  He has 2+ femoral pulses bilaterally and no bruits.  EXTREMITIES:  No edema.  I can palpate no cords.  Of note, the patient did describe mild pain in the right thigh area during the examination.  There were 2+ dorsalis pedis pulses bilaterally.  He has a negative Homans bilaterally.  LABORATORY DATA:  BUN 20, creatinine 1.3, potassium 3.8.  Hemoglobin 15.6, hematocrit 44.7, white blood cell count 4.7.  His  initial CK-MB was normal.  His electrocardiogram shows a normal sinus rhythm at a rate of 68.  The axis is normal.  There is delayed R-wave progression and an RV conduction delay, but otherwise unremarkable with no ST changes.  His chest x-ray shows no acute disease.  DIAGNOSES: 1. Atypical chest pain. 2. History of gastroesophageal reflux disease. 3. Positive family history of coronary disease.  PLAN:  Bryce Bond presents for evaluation of chest pain.  His symptoms are extremely atypical in that they last for seconds at a time.  I think that the likelihood of coronary disease is  very small.  We will continue to cycle enzymes (I will check a second set eight hours after the first and if negative, he will have essentially ruled out).  If he rules out, we will plan to risk stratify with a stress Cardiolite in our office.  I will also check a D-dimer, as well as ultrasound of his right lower extremity given his recent travel to [place], [place], although I think pulmonary embolus is unlikely.  If the above studies are negative, we will plan to discharge the patient later this afternoon with outpatient stress as above.  This will be an admission for 24-hour observation. Dictated by:   Madolyn Frieze. Jens Som, M.D. [facility] Attending:  Madolyn Frieze. Jens Som, M.D. [facility] DD:  03/10/01 TD:  03/10/01 Job: [id number]

## 2010-06-30 ENCOUNTER — Other Ambulatory Visit: Payer: Self-pay

## 2010-06-30 MED ORDER — OMEPRAZOLE 20 MG PO CPDR: 20.0000 mg | DELAYED_RELEASE_CAPSULE | Freq: Every day | ORAL | Status: DC

## 2010-06-30 NOTE — Telephone Encounter (Signed)
rx request from [facility] for 90 day supply omeprazole---rx faxed to pharmacy

## 2010-09-16 ENCOUNTER — Ambulatory Visit (INDEPENDENT_AMBULATORY_CARE_PROVIDER_SITE_OTHER): Payer: BC Managed Care – PPO | Admitting: Family Medicine

## 2010-09-16 ENCOUNTER — Encounter: Payer: Self-pay | Admitting: Family Medicine

## 2010-09-16 DIAGNOSIS — E291 Testicular hypofunction: Secondary | ICD-10-CM

## 2010-09-16 DIAGNOSIS — F528 Other sexual dysfunction not due to a substance or known physiological condition: Secondary | ICD-10-CM

## 2010-09-16 MED ORDER — TESTOSTERONE 20.25 MG/ACT (1.62%) TD GEL: 2.0000 | Freq: Once | TRANSDERMAL | Status: DC

## 2010-09-16 NOTE — Patient Instructions (Signed)
Began  2 pumps  of the testosterone supplement daily.  Return in two months for follow-up............ Testosterone level, one week prior, nonfasting

## 2010-09-16 NOTE — Progress Notes (Signed)
  Subjective:    Patient ID: Bryce Bond, male    DOB: [birth year], 65 y.o.   MRN: [id number]  HPI  Bryce Bond is a 65 year old male, married, nonsmoker comes in today for reevaluation of hypogonadism.  He was diagnosed a couple years ago by his urologist to have hypogonadism and took the AndroGel and was able to increase his vel up in the 400 range.  It worked well however, he stopped the medication, but now his symptoms have returned.  He would like to restart that.   Review of Systems    General review of systems negative except for sense of fatigue, decreased libido, and decreased sexual function Objective:   Physical Exam Well-developed well-nourished man in no acute distress,,,,,,,, physical examination in February, showed normal exam and normal PSA      Assessment & Plan:  Hypo gonad ism,,,,,,,,,,,, baseline testosterone level restart t- gel follow-up in two months

## 2010-09-17 LAB — TESTOSTERONE: Testosterone: 258.48 ng/dL — ABNORMAL LOW (ref 350.00–890.00)

## 2010-09-29 ENCOUNTER — Other Ambulatory Visit: Payer: Self-pay | Admitting: *Deleted

## 2010-09-29 MED ORDER — OMEPRAZOLE 20 MG PO CPDR: 20.0000 mg | DELAYED_RELEASE_CAPSULE | Freq: Every day | ORAL | Status: DC

## 2010-10-16 ENCOUNTER — Telehealth: Payer: Self-pay | Admitting: *Deleted

## 2010-10-16 NOTE — Telephone Encounter (Signed)
yes

## 2010-10-16 NOTE — Telephone Encounter (Signed)
Pt just started using Androgel today, and his follow up appt is Sept. 29.  He is asking if he should postpone his labs and visit since he is 2 weeks late on starting the meds.?

## 2010-10-17 NOTE — Telephone Encounter (Signed)
Left message on machine for patient  To call back and reschedule lab appointment

## 2010-11-10 ENCOUNTER — Other Ambulatory Visit: Payer: BC Managed Care – PPO

## 2010-11-17 ENCOUNTER — Ambulatory Visit: Payer: BC Managed Care – PPO | Admitting: Family Medicine

## 2010-11-27 ENCOUNTER — Other Ambulatory Visit (INDEPENDENT_AMBULATORY_CARE_PROVIDER_SITE_OTHER): Payer: BC Managed Care – PPO

## 2010-11-27 DIAGNOSIS — F528 Other sexual dysfunction not due to a substance or known physiological condition: Secondary | ICD-10-CM

## 2010-11-27 DIAGNOSIS — E291 Testicular hypofunction: Secondary | ICD-10-CM

## 2010-11-27 LAB — TESTOSTERONE: Testosterone: 285.2 ng/dL — ABNORMAL LOW (ref 350.00–890.00)

## 2010-12-04 ENCOUNTER — Ambulatory Visit: Payer: BC Managed Care – PPO | Admitting: Family Medicine

## 2010-12-22 ENCOUNTER — Ambulatory Visit (INDEPENDENT_AMBULATORY_CARE_PROVIDER_SITE_OTHER): Payer: BC Managed Care – PPO | Admitting: Family Medicine

## 2010-12-22 ENCOUNTER — Encounter: Payer: Self-pay | Admitting: Family Medicine

## 2010-12-22 DIAGNOSIS — F528 Other sexual dysfunction not due to a substance or known physiological condition: Secondary | ICD-10-CM

## 2010-12-22 DIAGNOSIS — E291 Testicular hypofunction: Secondary | ICD-10-CM

## 2010-12-22 MED ORDER — TESTOSTERONE 20.25 MG/ACT (1.62%) TD GEL: 2.0000 | Freq: Once | TRANSDERMAL | Status: DC

## 2010-12-22 NOTE — Progress Notes (Signed)
  Subjective:    Patient ID: Bryce Bond, male    DOB: 1945/03/06, 65 y.o.   MRN: [id number]  HPI Bryce Bond is a 65 year old man now nonsmoker, who comes in today for follow-up of hypokinetic as and  He is on a testosterone supplement one pump twice daily and feels well.  He initially decreased the dose to 1 p  per day because of side effects.  We discussed increasing the dose or staying where it is.  His level subtherapeutic, but he feels well.   Review of Systems    General review of systems otherwise negative.  BP normal 110/70 Objective:   Physical Exam Well-developed well-nourished, male in no acute distress       Assessment & Plan:  Hypogonadism continue supplemental testosterone, follow-up in one year or sooner if any problem

## 2010-12-22 NOTE — Patient Instructions (Signed)
Continue your current medications.  Follow-up in one year or sooner for any problems

## 2011-02-04 ENCOUNTER — Telehealth: Payer: Self-pay | Admitting: *Deleted

## 2011-02-04 NOTE — Telephone Encounter (Signed)
okk to add the hep C. profile

## 2011-02-04 NOTE — Telephone Encounter (Signed)
Pt has upcoming labs scheduled and would like to add a Hep C Profile to his order.

## 2011-02-04 NOTE — Telephone Encounter (Signed)
Labs added.

## 2011-02-09 ENCOUNTER — Other Ambulatory Visit (INDEPENDENT_AMBULATORY_CARE_PROVIDER_SITE_OTHER): Payer: BC Managed Care – PPO

## 2011-02-09 DIAGNOSIS — Z0000 Encounter for general adult medical examination without abnormal findings: Secondary | ICD-10-CM

## 2011-02-09 LAB — BASIC METABOLIC PANEL
BUN: 17 mg/dL (ref 6–23)
Calcium: 9.5 mg/dL (ref 8.4–10.5)
GFR: 71.55 mL/min (ref >60.00)
Glucose, Bld: 83 mg/dL (ref 70–99)
Potassium: 4.6 mEq/L (ref 3.5–5.1)
Sodium: 139 mEq/L (ref 135–145)

## 2011-02-09 LAB — POCT URINALYSIS DIPSTICK
Blood, UA: NEGATIVE
Leukocytes, UA: NEGATIVE
Nitrite, UA: NEGATIVE
Protein, UA: NEGATIVE
Urobilinogen, UA: 0.2
pH, UA: 5

## 2011-02-09 LAB — LIPID PANEL
HDL: 41.6 mg/dL (ref >39.00)
Total CHOL/HDL Ratio: 5
VLDL: 19 mg/dL (ref 0.0–40.0)

## 2011-02-09 LAB — CBC WITH DIFFERENTIAL/PLATELET
Basophils Absolute: 0 10*3/uL (ref 0.0–0.1)
Eosinophils Relative: 0.9 % (ref 0.0–5.0)
HCT: 44.2 % (ref 39.0–52.0)
Hemoglobin: 15 g/dL (ref 13.0–17.0)
Lymphocytes Relative: 22.8 % (ref 12.0–46.0)
Lymphs Abs: 1.2 10*3/uL (ref 0.7–4.0)
Monocytes Relative: 7 % (ref 3.0–12.0)
Platelets: 199 10*3/uL (ref 150.0–400.0)
RDW: 12.7 % (ref 11.5–14.6)
WBC: 5.3 10*3/uL (ref 4.5–10.5)

## 2011-02-09 LAB — HEPATIC FUNCTION PANEL
ALT: 26 U/L (ref 0–53)
AST: 26 U/L (ref 0–37)
Alkaline Phosphatase: 57 U/L (ref 39–117)
Total Bilirubin: 1.1 mg/dL (ref 0.3–1.2)

## 2011-02-09 LAB — PSA: PSA: 1.74 ng/mL (ref 0.10–4.00)

## 2011-02-09 LAB — TSH: TSH: 2.33 u[IU]/mL (ref 0.35–5.50)

## 2011-02-10 LAB — HEPATITIS C ANTIBODY: HCV Ab: NEGATIVE

## 2011-02-16 ENCOUNTER — Encounter: Payer: Self-pay | Admitting: Family Medicine

## 2011-02-16 ENCOUNTER — Ambulatory Visit (INDEPENDENT_AMBULATORY_CARE_PROVIDER_SITE_OTHER): Payer: BC Managed Care – PPO | Admitting: Family Medicine

## 2011-02-16 ENCOUNTER — Other Ambulatory Visit: Payer: Self-pay | Admitting: Family Medicine

## 2011-02-16 DIAGNOSIS — I2510 Atherosclerotic heart disease of native coronary artery without angina pectoris: Secondary | ICD-10-CM

## 2011-02-16 DIAGNOSIS — Z8249 Family history of ischemic heart disease and other diseases of the circulatory system: Secondary | ICD-10-CM

## 2011-02-16 DIAGNOSIS — F528 Other sexual dysfunction not due to a substance or known physiological condition: Secondary | ICD-10-CM

## 2011-02-16 DIAGNOSIS — K219 Gastro-esophageal reflux disease without esophagitis: Secondary | ICD-10-CM

## 2011-02-16 DIAGNOSIS — Z0000 Encounter for general adult medical examination without abnormal findings: Secondary | ICD-10-CM

## 2011-02-16 DIAGNOSIS — E291 Testicular hypofunction: Secondary | ICD-10-CM

## 2011-02-16 MED ORDER — TESTOSTERONE 20.25 MG/ACT (1.62%) TD GEL: 2.0000 | Freq: Once | TRANSDERMAL | Status: DC

## 2011-02-16 MED ORDER — OMEPRAZOLE 20 MG PO CPDR: 20.0000 mg | DELAYED_RELEASE_CAPSULE | Freq: Every day | ORAL | Status: DC

## 2011-02-16 MED ORDER — TADALAFIL 20 MG PO TABS: 20.0000 mg | ORAL_TABLET | Freq: Every day | ORAL | Status: DC | PRN

## 2011-02-16 NOTE — Progress Notes (Signed)
  Subjective:    Patient ID: Bryce Bond, male    DOB: 02/18/1945, 66 y.o.   MRN: [id number]  HPI Bryce Bond is a 66 year old married male, nonsmoker, who comes in today for general physical examination because of a history of reflux esophagitis, right dysfunction, and low testosterone.  He uses Prilosec 20 mg daily to prevent reflux.  He said a history of reflux esophagitis with a stricture and asymptomatic now.  He uses Cialis p.r.n. For ED.  He also uses testosterone gel 2 p.  daily because of low testosterone  He gets routine eye care.  Routine dental care.  Tetanus 2012 flu shot 2012 shingles.  Vaccine 2009.  Colonoscopy every 5 years GI because of a history of diverticulitis and colon polyps.  Review of systems otherwise negative.  Reviewed.  Family history because of his concern about taking a statin.  His father died mid 35s from a heart attack, but his father was diabetic overweight, and an alcoholic.  Mother alive and well 6 sisters 3 of which have dementia.  No coronary disease.  We discussed risks, options, including continuing to do what he is doing, which is good.  Diet and exercise.  We also offered further diagnostic evaluation, including a stress test.  He had one a couple years ago, was normal   Review of Systems  Constitutional: Negative.   HENT: Negative.   Eyes: Negative.   Respiratory: Negative.   Cardiovascular: Negative.   Gastrointestinal: Negative.   Genitourinary: Negative.   Musculoskeletal: Negative.   Skin: Negative.   Neurological: Negative.   Hematological: Negative.   Psychiatric/Behavioral: Negative.        Objective:   Physical Exam  Constitutional: He is oriented to person, place, and time. He appears well-developed and well-nourished.  HENT:  Head: Normocephalic and atraumatic.  Right Ear: External ear normal.  Left Ear: External ear normal.  Nose: Nose normal.  Mouth/Throat: Oropharynx is clear and moist.  Eyes: Conjunctivae and EOM are  normal. Pupils are equal, round, and reactive to light.  Neck: Normal range of motion. Neck supple. No JVD present. No tracheal deviation present. No thyromegaly present.  Cardiovascular: Normal rate, regular rhythm, normal heart sounds and intact distal pulses.  Exam reveals no gallop and no friction rub.   No murmur heard. Pulmonary/Chest: Effort normal and breath sounds normal. No stridor. No respiratory distress. He has no wheezes. He has no rales. He exhibits no tenderness.  Abdominal: Soft. Bowel sounds are normal. He exhibits no distension and no mass. There is no tenderness. There is no rebound and no guarding.  Genitourinary: Rectum normal, prostate normal and penis normal. Guaiac negative stool. No penile tenderness.  Musculoskeletal: Normal range of motion. He exhibits no edema and no tenderness.  Lymphadenopathy:    He has no cervical adenopathy.  Neurological: He is alert and oriented to person, place, and time. He has normal reflexes. No cranial nerve deficit. He exhibits normal muscle tone.  Skin: Skin is warm and dry. No rash noted. No erythema. No pallor.  Psychiatric: He has a normal mood and affect. His behavior is normal. Judgment and thought content normal.          Assessment & Plan:  Healthy male.  Reflux esophagitis.  Continue Prilosec 20 mg daily.  Recto dysfunction.  Continue Cialis p.r.n.  Low testosterone continue supplement.  Return in one year or sooner if any problems

## 2011-02-16 NOTE — Patient Instructions (Signed)
Continue your current medications and good health habits.  Return in one year or sooner if any problem

## 2011-03-02 ENCOUNTER — Encounter: Payer: BC Managed Care – PPO | Admitting: Physician Assistant

## 2011-03-09 ENCOUNTER — Encounter: Payer: BC Managed Care – PPO | Admitting: Physician Assistant

## 2011-03-18 ENCOUNTER — Other Ambulatory Visit (INDEPENDENT_AMBULATORY_CARE_PROVIDER_SITE_OTHER): Payer: BC Managed Care – PPO

## 2011-03-18 DIAGNOSIS — E291 Testicular hypofunction: Secondary | ICD-10-CM

## 2011-03-18 DIAGNOSIS — F528 Other sexual dysfunction not due to a substance or known physiological condition: Secondary | ICD-10-CM

## 2011-03-20 ENCOUNTER — Encounter: Payer: Self-pay | Admitting: Physician Assistant

## 2011-03-20 ENCOUNTER — Ambulatory Visit (INDEPENDENT_AMBULATORY_CARE_PROVIDER_SITE_OTHER): Payer: BC Managed Care – PPO | Admitting: Physician Assistant

## 2011-03-20 DIAGNOSIS — R9439 Abnormal result of other cardiovascular function study: Secondary | ICD-10-CM

## 2011-03-20 DIAGNOSIS — I2510 Atherosclerotic heart disease of native coronary artery without angina pectoris: Secondary | ICD-10-CM

## 2011-03-20 NOTE — Patient Instructions (Signed)
PLEASE SCHEDULE PT TO HAVE CARDIAC CT ANGIO @ [facility]; DX ABNORMAL STRESS TEST; PA DISCUSSED WITH DOD TODAY DR. Johney Frame.

## 2011-03-20 NOTE — Progress Notes (Signed)
Exercise Treadmill Test  Pre-Exercise Testing Evaluation Rhythm: normal sinus  Rate: 69   PR:  19 QRS:  .08  QT:  .38 QTc: .40     Test  Exercise Tolerance Test Ordering MD: Kelle Darting ,MD  Interpreting MD:  Tereso Newcomer PA-C  Unique Test No: 1  Treadmill:  1  Indication for ETT: known ASHD  Contraindication to ETT: No   Stress Modality: exercise - treadmill  Cardiac Imaging Performed: non   Protocol: standard Bruce - maximal  Max BP:  199/78  Max MPHR (bpm):  156 85% MPR (bpm):  132  MPHR obtained (bpm):  162 % MPHR obtained:  102%  Reached 85% MPHR (min:sec):  5:40 Total Exercise Time (min-sec):  9:30  Workload in METS:  10.9 Borg Scale: 15  Reason ETT Terminated:  desired heart rate attained    ST Segment Analysis At Rest: normal ST segments - no evidence of significant ST depression With Exercise: significant ischemic ST depression  Other Information Arrhythmia:  No Angina during ETT:  absent (0) Quality of ETT:  diagnostic  ETT Interpretation:  abnormal - evidence of ST depression consistent with ischemia  Comments: Good exercise tolerance. No chest pain. Normal BP response to exercise. Significant ST depression in V4-6 at the end of exercise that quickly resolves with rest.   Recommendations: Arrange Cardiac CT angiography. Discussed with Dr. Hillis Range (DOD) today. Tereso Newcomer, PA-C  12:30 PM 03/20/2011

## 2011-03-21 LAB — NMR LIPOPROFILE WITH LIPIDS
HDL Particle Number: 27.1 umol/L — ABNORMAL LOW (ref >=30.5)
LDL (calc): 158 mg/dL — ABNORMAL HIGH (ref <100)
LDL Particle Number: 2217 nmol/L — ABNORMAL HIGH (ref <1000)
LP-IR Score: 62 — ABNORMAL HIGH (ref <=45)

## 2011-03-23 ENCOUNTER — Other Ambulatory Visit: Payer: Self-pay | Admitting: Family Medicine

## 2011-03-23 DIAGNOSIS — E7849 Other hyperlipidemia: Secondary | ICD-10-CM

## 2011-03-23 MED ORDER — ATORVASTATIN CALCIUM 20 MG PO TABS: 20.0000 mg | ORAL_TABLET | Freq: Every day | ORAL | Status: DC

## 2011-03-25 ENCOUNTER — Telehealth: Payer: Self-pay | Admitting: Family Medicine

## 2011-03-25 NOTE — Telephone Encounter (Signed)
Patient would like a call with Treadmill test results. Ph. [phone]. Please assist

## 2011-03-26 NOTE — Telephone Encounter (Signed)
Left detailed message on machine.

## 2011-03-31 ENCOUNTER — Other Ambulatory Visit: Payer: Self-pay | Admitting: Physician Assistant

## 2011-03-31 ENCOUNTER — Other Ambulatory Visit: Payer: Self-pay | Admitting: *Deleted

## 2011-03-31 ENCOUNTER — Encounter: Payer: Self-pay | Admitting: Physician Assistant

## 2011-03-31 DIAGNOSIS — I2510 Atherosclerotic heart disease of native coronary artery without angina pectoris: Secondary | ICD-10-CM

## 2011-03-31 DIAGNOSIS — R9439 Abnormal result of other cardiovascular function study: Secondary | ICD-10-CM

## 2011-04-02 ENCOUNTER — Other Ambulatory Visit (HOSPITAL_COMMUNITY): Payer: BC Managed Care – PPO

## 2011-04-02 ENCOUNTER — Telehealth: Payer: Self-pay | Admitting: Physician Assistant

## 2011-04-02 ENCOUNTER — Ambulatory Visit (HOSPITAL_COMMUNITY): Payer: BC Managed Care – PPO | Attending: Cardiology | Admitting: Radiology

## 2011-04-02 VITALS — BP 155/81 | Ht 68.0 in | Wt 170.0 lb

## 2011-04-02 DIAGNOSIS — R9439 Abnormal result of other cardiovascular function study: Secondary | ICD-10-CM

## 2011-04-02 DIAGNOSIS — E785 Hyperlipidemia, unspecified: Secondary | ICD-10-CM | POA: Insufficient documentation

## 2011-04-02 DIAGNOSIS — R9431 Abnormal electrocardiogram [ECG] [EKG]: Secondary | ICD-10-CM | POA: Insufficient documentation

## 2011-04-02 DIAGNOSIS — Z8249 Family history of ischemic heart disease and other diseases of the circulatory system: Secondary | ICD-10-CM | POA: Insufficient documentation

## 2011-04-02 DIAGNOSIS — I2510 Atherosclerotic heart disease of native coronary artery without angina pectoris: Secondary | ICD-10-CM | POA: Insufficient documentation

## 2011-04-02 MED ORDER — TECHNETIUM TC 99M TETROFOSMIN IV KIT
30.0000 | PACK | Freq: Once | INTRAVENOUS | Status: AC | PRN
  Administered 2011-04-02: 30 via INTRAVENOUS

## 2011-04-02 MED ORDER — TECHNETIUM TC 99M TETROFOSMIN IV KIT
10.0000 | PACK | Freq: Once | INTRAVENOUS | Status: AC | PRN
  Administered 2011-04-02: 10 via INTRAVENOUS

## 2011-04-02 NOTE — Progress Notes (Signed)
[facility] SITE 3 NUCLEAR MED [address] [place] [address] [id number]  Cardiology Nuclear Med Study  Bryce Bond is a 66 y.o. male [id number] 1945/08/22   Nuclear Med Background Indication for Stress Test:  Evaluation for Ischemia and Abnormal GXT History:  '00 WNU:UVOZDG, EF=67%; 03/20/11 [id number] Cardiac Risk Factors: Family History - CAD and Lipids  Symptoms:  No cardiac symptoms   Nuclear Pre-Procedure Caffeine/Decaff Intake:  12:00am NPO After: 7:00pm   Lungs:  Clear. IV 0.9% NS with Angio Cath:  20g  IV Site: R Antecubital  IV Started by:  Bonnita Levan, RN  Chest Size (in):  44 Cup Size: n/a  Height: 5\' 8"  (1.727 m)  Weight:  170 lb (77.111 kg)  BMI:  Body mass index is 25.85 kg/(m^2). Tech Comments:  N/A    Nuclear Med Study 1 or 2 day study: 1 day  Stress Test Type:  Stress  Reading MD: Bryce Millers, MD  Order Authorizing Provider:  Kelle Darting, MD; Tereso Newcomer, PA-C  Resting Radionuclide: Technetium 19m Tetrofosmin  Resting Radionuclide Dose: 10.8 mCi   Stress Radionuclide:  Technetium 66m Tetrofosmin  Stress Radionuclide Dose: 32.9 mCi           Stress Protocol Rest HR: 62 Stress HR: 157  Rest BP: Sitting:155/81; Standing:148/87 Stress BP: 210/79  Exercise Time (min): 10:30 METS: 12.5   Predicted Max HR: 156 bpm % Max HR: 100.64 bpm Rate Pressure Product: 56387   Dose of Adenosine (mg):  n/a Dose of Lexiscan: n/a mg  Dose of Atropine (mg): n/a Dose of Dobutamine: n/a mcg/kg/min (at max HR)  Stress Test Technologist: Smiley Houseman, CMA-N  Nuclear Technologist:  Domenic Polite, CNMT     Rest Procedure:  Myocardial perfusion imaging was performed at rest 45 minutes following the intravenous administration of Technetium 31m Tetrofosmin.  Rest ECG: No acute changes.  Stress Procedure:  The patient exercised for 10:30 on the treadmill utilizing the Bruce protocol.  The patient stopped due to fatigue and denied any  chest pain.  There were ST-T wave changes, most likely consistent with the hypertensive response, 210/79, and occasion PAC's were noted.  Technetium 76m Tetrofosmin was injected at peak exercise and myocardial perfusion imaging was performed after a brief delay.  Stress ECG: Significant ST abnormalities consistent with ischemia.  QPS Raw Data Images:  Acquisition technically good; normal left ventricular size. Stress Images:  Normal homogeneous uptake in all areas of the myocardium. Rest Images:  Normal homogeneous uptake in all areas of the myocardium. Subtraction (SDS):  No evidence of ischemia. Transient Ischemic Dilatation (Normal <1.22):  .82 Lung/Heart Ratio (Normal <0.45):  .21  Quantitative Gated Spect Images QGS EDV:  80 ml QGS ESV:  25 ml QGS cine images:  NL LV Function; NL Wall Motion QGS EF: 69%  Impression Exercise Capacity:  Good exercise capacity. BP Response:  Hypertensive blood pressure response. Clinical Symptoms:  No chest pain or SOB. ECG Impression:  Significant ST abnormalities consistent with ischemia. Comparison with Prior Nuclear Study: No previous nuclear study performed  Overall Impression:  Normal stress nuclear study (ECG changes noted but normal perfusion).   Bryce Bond

## 2011-04-02 NOTE — Telephone Encounter (Signed)
Called patient and left message.  ETT-Myoview is normal.  If he has any further questions, let me know. Tereso Newcomer, PA-C  5:25 PM 04/02/2011

## 2011-04-02 NOTE — Telephone Encounter (Signed)
New problem:  Per Lawanna Kobus, message from web site subject 3/14 test results.    my PCP is out of town on vacation through next week,. Can you have scott weaver call to discuss results.  directed line [phone].

## 2011-04-03 ENCOUNTER — Telehealth: Payer: Self-pay | Admitting: *Deleted

## 2011-04-03 NOTE — Telephone Encounter (Signed)
Pt calling back re previous message, wants to make sure he is called before the weekend

## 2011-04-03 NOTE — Telephone Encounter (Signed)
F/U  Patient returning call to Danielle Rankin per [employer]. Patient can be reached at [phone]

## 2011-04-03 NOTE — Telephone Encounter (Signed)
Message copied by Tarri Fuller on Fri Apr 03, 2011  2:31 PM ------      Message from: [place], [place] T      Created: Fri Apr 03, 2011  8:49 AM       Normal nuclear study      I left a message for him to call back yesterday      I am happy to talk with him when he calls back      I will also send a copy to his PCP.      Tereso Newcomer, PA-C  8:48 AM 04/03/2011

## 2011-04-03 NOTE — Telephone Encounter (Signed)
Tereso Newcomer, PA-C s/w pt about results, pt is aware of results today PA recommended pt f/u w/PCP. Danielle Rankin

## 2011-04-03 NOTE — Telephone Encounter (Signed)
Pt called and given message ot does want a call back at [phone]

## 2011-04-03 NOTE — Telephone Encounter (Signed)
Will forward to [place] and [place].

## 2011-04-05 NOTE — Telephone Encounter (Signed)
Spoke with patient and discussed stress test results. Tereso Newcomer, PA-C  8:50 PM 04/05/2011

## 2011-04-22 ENCOUNTER — Telehealth: Payer: Self-pay | Admitting: Family Medicine

## 2011-04-22 NOTE — Telephone Encounter (Signed)
Patient called stating that he has had vomitting and diarrhea for 24 hours and would like to be seen tomorrow. Please assist.

## 2011-05-12 ENCOUNTER — Telehealth: Payer: Self-pay | Admitting: Family Medicine

## 2011-05-13 NOTE — Telephone Encounter (Signed)
Opened in error

## 2011-07-09 ENCOUNTER — Other Ambulatory Visit (INDEPENDENT_AMBULATORY_CARE_PROVIDER_SITE_OTHER): Payer: BC Managed Care – PPO

## 2011-07-09 DIAGNOSIS — E785 Hyperlipidemia, unspecified: Secondary | ICD-10-CM

## 2011-07-09 LAB — LIPID PANEL
HDL: 45.6 mg/dL (ref >39.00)
Total CHOL/HDL Ratio: 3

## 2011-12-21 ENCOUNTER — Encounter: Payer: Self-pay | Admitting: Family Medicine

## 2011-12-21 ENCOUNTER — Encounter: Payer: Self-pay | Admitting: *Deleted

## 2011-12-21 ENCOUNTER — Ambulatory Visit (INDEPENDENT_AMBULATORY_CARE_PROVIDER_SITE_OTHER): Payer: BC Managed Care – PPO | Admitting: Family Medicine

## 2011-12-21 VITALS — BP 140/84 | Temp 98.3°F | Wt 167.0 lb

## 2011-12-21 DIAGNOSIS — R358 Other polyuria: Secondary | ICD-10-CM

## 2011-12-21 LAB — CBC WITH DIFFERENTIAL/PLATELET
Eosinophils Relative: 1 % (ref 0.0–5.0)
HCT: 45.9 % (ref 39.0–52.0)
Lymphs Abs: 1.2 10*3/uL (ref 0.7–4.0)
Monocytes Relative: 7 % (ref 3.0–12.0)
Platelets: 183 10*3/uL (ref 150.0–400.0)
RBC: 4.87 Mil/uL (ref 4.22–5.81)
WBC: 5.5 10*3/uL (ref 4.5–10.5)

## 2011-12-21 LAB — BASIC METABOLIC PANEL
Calcium: 9.3 mg/dL (ref 8.4–10.5)
Chloride: 101 mEq/L (ref 96–112)
Creatinine, Ser: 0.9 mg/dL (ref 0.4–1.5)
GFR: 87.7 mL/min (ref >60.00)
Potassium: 3.8 mEq/L (ref 3.5–5.1)

## 2011-12-21 LAB — HEMOGLOBIN A1C: Hgb A1c MFr Bld: 5.6 % (ref 4.6–6.5)

## 2011-12-21 LAB — POCT URINALYSIS DIPSTICK
Bilirubin, UA: NEGATIVE
Ketones, UA: NEGATIVE
Leukocytes, UA: NEGATIVE
pH, UA: 7

## 2011-12-21 NOTE — Progress Notes (Signed)
  Subjective:    Patient ID: Bryce Bond, male    DOB: [birth year], 66 y.o.   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 Review of systems totally negative. Random fasting blood sugar this morning 86  Family history negative except that was diagnosed with diabetes when he was in his 42s   Review of Systems Gen. review of systems negative    Objective:   Physical Exam  Well-developed well-nourished male in no acute distress vital signs stable random blood sugar 86      Assessment & Plan:  Polyuria unknown etiology begin workup

## 2011-12-23 ENCOUNTER — Telehealth: Payer: Self-pay | Admitting: Family Medicine

## 2011-12-23 NOTE — Telephone Encounter (Signed)
Left message on machine for patient with lab results 

## 2011-12-23 NOTE — Telephone Encounter (Signed)
Pt would like blood work and ua results °

## 2012-01-11 ENCOUNTER — Other Ambulatory Visit: Payer: Self-pay | Admitting: *Deleted

## 2012-01-11 DIAGNOSIS — E7849 Other hyperlipidemia: Secondary | ICD-10-CM

## 2012-01-11 DIAGNOSIS — F528 Other sexual dysfunction not due to a substance or known physiological condition: Secondary | ICD-10-CM

## 2012-01-11 DIAGNOSIS — K219 Gastro-esophageal reflux disease without esophagitis: Secondary | ICD-10-CM

## 2012-01-11 MED ORDER — OMEPRAZOLE 20 MG PO CPDR: 20.0000 mg | DELAYED_RELEASE_CAPSULE | Freq: Every day | ORAL | Status: DC

## 2012-01-11 MED ORDER — TADALAFIL 20 MG PO TABS: 20.0000 mg | ORAL_TABLET | Freq: Every day | ORAL | Status: DC | PRN

## 2012-01-11 MED ORDER — ATORVASTATIN CALCIUM 20 MG PO TABS: 20.0000 mg | ORAL_TABLET | Freq: Every day | ORAL | Status: DC

## 2012-02-10 ENCOUNTER — Other Ambulatory Visit: Payer: BC Managed Care – PPO

## 2012-02-12 ENCOUNTER — Other Ambulatory Visit (INDEPENDENT_AMBULATORY_CARE_PROVIDER_SITE_OTHER): Payer: BC Managed Care – PPO

## 2012-02-12 DIAGNOSIS — Z0000 Encounter for general adult medical examination without abnormal findings: Secondary | ICD-10-CM

## 2012-02-12 LAB — BASIC METABOLIC PANEL
BUN: 18 mg/dL (ref 6–23)
Chloride: 102 mEq/L (ref 96–112)
Potassium: 4.9 mEq/L (ref 3.5–5.1)
Sodium: 140 mEq/L (ref 135–145)

## 2012-02-12 LAB — TSH: TSH: 1.9 u[IU]/mL (ref 0.35–5.50)

## 2012-02-12 LAB — POCT URINALYSIS DIPSTICK
Blood, UA: NEGATIVE
Leukocytes, UA: NEGATIVE
Nitrite, UA: NEGATIVE
Protein, UA: NEGATIVE
pH, UA: 6

## 2012-02-12 LAB — LIPID PANEL
HDL: 39.4 mg/dL (ref >39.00)
LDL Cholesterol: 76 mg/dL (ref 0–99)
Total CHOL/HDL Ratio: 3
Triglycerides: 41 mg/dL (ref 0.0–149.0)

## 2012-02-12 LAB — CBC WITH DIFFERENTIAL/PLATELET
Basophils Relative: 0.3 % (ref 0.0–3.0)
Eosinophils Relative: 1.2 % (ref 0.0–5.0)
HCT: 45.1 % (ref 39.0–52.0)
Hemoglobin: 15.2 g/dL (ref 13.0–17.0)
Lymphs Abs: 1.1 10*3/uL (ref 0.7–4.0)
MCV: 93.3 fl (ref 78.0–100.0)
Monocytes Absolute: 0.4 10*3/uL (ref 0.1–1.0)
Monocytes Relative: 7.8 % (ref 3.0–12.0)
Neutro Abs: 3 10*3/uL (ref 1.4–7.7)
Platelets: 198 10*3/uL (ref 150.0–400.0)
RBC: 4.83 Mil/uL (ref 4.22–5.81)
WBC: 4.5 10*3/uL (ref 4.5–10.5)

## 2012-02-12 LAB — HEPATIC FUNCTION PANEL
ALT: 30 U/L (ref 0–53)
AST: 30 U/L (ref 0–37)
Albumin: 4.1 g/dL (ref 3.5–5.2)
Total Protein: 7.2 g/dL (ref 6.0–8.3)

## 2012-02-17 ENCOUNTER — Encounter: Payer: BC Managed Care – PPO | Admitting: Family Medicine

## 2012-02-25 ENCOUNTER — Encounter: Payer: BC Managed Care – PPO | Admitting: Family Medicine

## 2012-03-08 ENCOUNTER — Encounter: Payer: Self-pay | Admitting: Family Medicine

## 2012-03-08 ENCOUNTER — Ambulatory Visit (INDEPENDENT_AMBULATORY_CARE_PROVIDER_SITE_OTHER): Payer: BC Managed Care – PPO | Admitting: Family Medicine

## 2012-03-08 VITALS — BP 124/80 | Temp 99.1°F | Ht 67.75 in | Wt 165.0 lb

## 2012-03-08 DIAGNOSIS — J309 Allergic rhinitis, unspecified: Secondary | ICD-10-CM

## 2012-03-08 DIAGNOSIS — E7849 Other hyperlipidemia: Secondary | ICD-10-CM

## 2012-03-08 DIAGNOSIS — K219 Gastro-esophageal reflux disease without esophagitis: Secondary | ICD-10-CM

## 2012-03-08 DIAGNOSIS — F528 Other sexual dysfunction not due to a substance or known physiological condition: Secondary | ICD-10-CM

## 2012-03-08 DIAGNOSIS — E785 Hyperlipidemia, unspecified: Secondary | ICD-10-CM

## 2012-03-08 MED ORDER — ATORVASTATIN CALCIUM 20 MG PO TABS: 20.0000 mg | ORAL_TABLET | Freq: Every day | ORAL | Status: DC

## 2012-03-08 MED ORDER — TADALAFIL 20 MG PO TABS: 20.0000 mg | ORAL_TABLET | Freq: Every day | ORAL | Status: DC | PRN

## 2012-03-08 MED ORDER — OMEPRAZOLE 20 MG PO CPDR: 20.0000 mg | DELAYED_RELEASE_CAPSULE | Freq: Every day | ORAL | Status: DC

## 2012-03-08 NOTE — Patient Instructions (Signed)
Continue your current medications  If you can tolerate it take an aspirin tablet daily  Return in one year sooner if any problem

## 2012-03-08 NOTE — Progress Notes (Signed)
  Subjective:    Patient ID: Bryce Bond, male    DOB: 1945/09/25, 67 y.o.   MRN: [id number]  HPI Annette Stable is a 67 year old married male nonsmoker who comes in today for general physical examination because of a history of hyperlipidemia, reflux esophagitis, and mild ED  Medications are unchanged  He gets routine eye care, dental care, colonoscopy every 5 years because of a history of a polyp  Cardiac stress test last year normal  Vaccinations up-to-date  Social history he is retired from the [employer],,,,,,,, still working part-time 2 days weekly at exercising on a regular basis. His wife is an Tree surgeon    Review of Systems  Constitutional: Negative.   HENT: Negative.   Eyes: Negative.   Respiratory: Negative.   Cardiovascular: Negative.   Gastrointestinal: Negative.   Genitourinary: Negative.   Musculoskeletal: Negative.   Skin: Negative.   Neurological: Negative.   Psychiatric/Behavioral: Negative.        Objective:   Physical Exam  Constitutional: He is oriented to person, place, and time. He appears well-developed and well-nourished.  HENT:  Head: Normocephalic and atraumatic.  Right Ear: External ear normal.  Left Ear: External ear normal.  Nose: Nose normal.  Mouth/Throat: Oropharynx is clear and moist.  Eyes: Conjunctivae and EOM are normal. Pupils are equal, round, and reactive to light.  Neck: Normal range of motion. Neck supple. No JVD present. No tracheal deviation present. No thyromegaly present.  Cardiovascular: Normal rate, regular rhythm, normal heart sounds and intact distal pulses.  Exam reveals no gallop and no friction rub.   No murmur heard. Pulmonary/Chest: Effort normal and breath sounds normal. No stridor. No respiratory distress. He has no wheezes. He has no rales. He exhibits no tenderness.  Abdominal: Soft. Bowel sounds are normal. He exhibits no distension and no mass. There is no tenderness. There is no rebound and no guarding.   Genitourinary: Rectum normal, prostate normal and penis normal. Guaiac negative stool. No penile tenderness.  Musculoskeletal: Normal range of motion. He exhibits no edema and no tenderness.  Lymphadenopathy:    He has no cervical adenopathy.  Neurological: He is alert and oriented to person, place, and time. He has normal reflexes. No cranial nerve deficit. He exhibits normal muscle tone.  Skin: Skin is warm and dry. No rash noted. No erythema. No pallor.  Total body skin exam normal except for tattoo right hip  Psychiatric: He has a normal mood and affect. His behavior is normal. Judgment and thought content normal.          Assessment & Plan:   healthy male  Hyperlipidemia continue Lipitor  Reflux esophagitis continue Prilosec 20 mg daily  ED Cialis.

## 2012-04-05 ENCOUNTER — Encounter: Payer: Self-pay | Admitting: Internal Medicine

## 2012-04-19 ENCOUNTER — Ambulatory Visit (AMBULATORY_SURGERY_CENTER): Payer: Medicare Other | Admitting: *Deleted

## 2012-04-19 VITALS — Ht 68.0 in | Wt 162.2 lb

## 2012-04-19 DIAGNOSIS — Z1211 Encounter for screening for malignant neoplasm of colon: Secondary | ICD-10-CM

## 2012-04-19 MED ORDER — MOVIPREP 100 G PO SOLR: ORAL | Status: DC

## 2012-05-03 ENCOUNTER — Ambulatory Visit (AMBULATORY_SURGERY_CENTER): Payer: Medicare Other | Admitting: Internal Medicine

## 2012-05-03 ENCOUNTER — Encounter: Payer: Self-pay | Admitting: Internal Medicine

## 2012-05-03 VITALS — BP 118/73 | HR 52 | Temp 98.0°F | Resp 17 | Ht 68.0 in | Wt 162.0 lb

## 2012-05-03 DIAGNOSIS — K5732 Diverticulitis of large intestine without perforation or abscess without bleeding: Secondary | ICD-10-CM

## 2012-05-03 DIAGNOSIS — Z86010 Personal history of colonic polyps: Secondary | ICD-10-CM

## 2012-05-03 DIAGNOSIS — Z1211 Encounter for screening for malignant neoplasm of colon: Secondary | ICD-10-CM

## 2012-05-03 MED ORDER — SODIUM CHLORIDE 0.9 % IV SOLN: 500.0000 mL | INTRAVENOUS | Status: DC

## 2012-05-03 NOTE — Progress Notes (Signed)
Lidocaine-40mg IV prior to Propofol InductionPropofol given over incremental dosages 

## 2012-05-03 NOTE — Op Note (Signed)
[facility] [address] [place], [address]   COLONOSCOPY PROCEDURE REPORT  PATIENT: Bryce, Bond  MR#: [id number] BIRTHDATE: [birth year] , 66  yrs. old GENDER: Male ENDOSCOPIST: Roxy Cedar, MD REFERRED [id number] Program Recall PROCEDURE DATE:  05/03/2012 PROCEDURE:   Colonoscopy, surveillance ASA CLASS:   Class II INDICATIONS:Patient's personal history of adenomatous colon polyps.Index 2001 (advanced adenoma); f/u 2004, 2009 MEDICATIONS: MAC sedation, administered by CRNA and propofol (Diprivan) 150mg  IV  DESCRIPTION OF PROCEDURE:   After the risks benefits and alternatives of the procedure were thoroughly explained, informed consent was obtained.  A digital rectal exam revealed no abnormalities of the rectum.   The LB [id number]  endoscope was introduced through the anus and advanced to the cecum, which was identified by both the appendix and ileocecal valve. No adverse events experienced.   The quality of the prep was excellent, using MoviPrep  The instrument was then slowly withdrawn as the colon was fully examined.      COLON FINDINGS: Severe diverticulosis was noted The finding was in the left colon.   The colon was otherwise normal.  There was no inflammation, polyps or cancers .  Retroflexed views revealed internal hemorrhoids. The time to cecum=2 minutes 50 seconds. Withdrawal time=6 minutes 55 seconds.  The scope was withdrawn and the procedure completed. COMPLICATIONS: There were no complications.  ENDOSCOPIC IMPRESSION: 1.   Severe diverticulosis was noted in the left colon 2.   The colon was otherwise normal  RECOMMENDATIONS: 1. Follow up colonoscopy in 5 years (hx advanced neoplasia)   eSigned:  Roxy Cedar, MD 05/03/2012 10:29 AM   cc: Roderick Pee, MD and The Patient

## 2012-05-03 NOTE — Patient Instructions (Addendum)

## 2012-05-03 NOTE — Progress Notes (Signed)
Patient did not experience any of the following events: a burn prior to discharge; a fall within the facility; wrong site/side/patient/procedure/implant event; or a hospital transfer or hospital admission upon discharge from the facility. (G8907) Patient did not have preoperative order for IV antibiotic SSI prophylaxis. (G8918)  

## 2012-05-04 ENCOUNTER — Telehealth: Payer: Self-pay

## 2012-05-04 NOTE — Telephone Encounter (Signed)
  Follow up Call-  Call back number 05/03/2012  Post procedure Call Back phone  # [phone]  Permission to leave phone message Yes     Patient questions:  Do you have a fever, pain , or abdominal swelling? no Pain Score  0 *  Have you tolerated food without any problems? yes  Have you been able to return to your normal activities? yes  Do you have any questions about your discharge instructions: Diet   no Medications  no Follow up visit  no  Do you have questions or concerns about your Care? no  Actions: * If pain score is 4 or above: No action needed, pain <4.  No problems per the pt. Maw

## 2012-08-26 ENCOUNTER — Telehealth: Payer: Self-pay | Admitting: Family Medicine

## 2012-08-26 MED ORDER — TADALAFIL 5 MG PO TABS: 5.0000 mg | ORAL_TABLET | Freq: Every day | ORAL | Status: DC | PRN

## 2012-08-26 NOTE — Telephone Encounter (Signed)
Patient is requesting cialis to 5 mg  Once daily #30 . Is it okay to change his prescription?

## 2012-08-26 NOTE — Telephone Encounter (Signed)
Left message on machine returning patient's call 

## 2012-08-26 NOTE — Telephone Encounter (Signed)
Yes,  OK to change to Cialis 5 mg daily

## 2012-08-26 NOTE — Telephone Encounter (Signed)
Pt would to speak w/ you concerning changing a medication and how to fill it. pls call

## 2012-08-26 NOTE — Addendum Note (Signed)
Addended by: Kern Reap B on: 08/26/2012 04:33 PM   Modules accepted: Orders

## 2012-11-24 ENCOUNTER — Other Ambulatory Visit: Payer: Self-pay

## 2013-03-07 ENCOUNTER — Other Ambulatory Visit: Payer: Medicare Other

## 2013-03-13 ENCOUNTER — Other Ambulatory Visit (INDEPENDENT_AMBULATORY_CARE_PROVIDER_SITE_OTHER): Payer: Medicare Other

## 2013-03-13 DIAGNOSIS — Z0000 Encounter for general adult medical examination without abnormal findings: Secondary | ICD-10-CM

## 2013-03-13 LAB — CBC WITH DIFFERENTIAL/PLATELET
Basophils Absolute: 0 10*3/uL (ref 0.0–0.1)
Basophils Relative: 0.2 % (ref 0.0–3.0)
EOS PCT: 1 % (ref 0.0–5.0)
Eosinophils Absolute: 0.1 10*3/uL (ref 0.0–0.7)
HEMATOCRIT: 45.1 % (ref 39.0–52.0)
HEMOGLOBIN: 15.1 g/dL (ref 13.0–17.0)
LYMPHS ABS: 1.4 10*3/uL (ref 0.7–4.0)
Lymphocytes Relative: 25.9 % (ref 12.0–46.0)
MCHC: 33.5 g/dL (ref 30.0–36.0)
MCV: 95.4 fl (ref 78.0–100.0)
MONOS PCT: 7.1 % (ref 3.0–12.0)
Monocytes Absolute: 0.4 10*3/uL (ref 0.1–1.0)
NEUTROS ABS: 3.6 10*3/uL (ref 1.4–7.7)
Neutrophils Relative %: 65.8 % (ref 43.0–77.0)
Platelets: 215 10*3/uL (ref 150.0–400.0)
RBC: 4.73 Mil/uL (ref 4.22–5.81)
RDW: 12.7 % (ref 11.5–14.6)
WBC: 5.4 10*3/uL (ref 4.5–10.5)

## 2013-03-13 LAB — POCT URINALYSIS DIPSTICK
Bilirubin, UA: NEGATIVE
Glucose, UA: NEGATIVE
Ketones, UA: NEGATIVE
Leukocytes, UA: NEGATIVE
Nitrite, UA: NEGATIVE
PROTEIN UA: NEGATIVE
RBC UA: NEGATIVE
SPEC GRAV UA: 1.02
UROBILINOGEN UA: 0.2
pH, UA: 6

## 2013-03-13 LAB — PSA: PSA: 2.21 ng/mL (ref 0.10–4.00)

## 2013-03-13 LAB — HEPATIC FUNCTION PANEL
ALBUMIN: 4 g/dL (ref 3.5–5.2)
ALT: 27 U/L (ref 0–53)
AST: 27 U/L (ref 0–37)
Alkaline Phosphatase: 60 U/L (ref 39–117)
Bilirubin, Direct: 0.2 mg/dL (ref 0.0–0.3)
TOTAL PROTEIN: 6.8 g/dL (ref 6.0–8.3)
Total Bilirubin: 1.3 mg/dL — ABNORMAL HIGH (ref 0.3–1.2)

## 2013-03-13 LAB — LIPID PANEL
CHOL/HDL RATIO: 3
Cholesterol: 148 mg/dL (ref 0–200)
HDL: 44.8 mg/dL (ref >39.00)
LDL CALC: 85 mg/dL (ref 0–99)
TRIGLYCERIDES: 91 mg/dL (ref 0.0–149.0)
VLDL: 18.2 mg/dL (ref 0.0–40.0)

## 2013-03-13 LAB — BASIC METABOLIC PANEL
BUN: 13 mg/dL (ref 6–23)
CO2: 31 mEq/L (ref 19–32)
Calcium: 9.7 mg/dL (ref 8.4–10.5)
Chloride: 104 mEq/L (ref 96–112)
Creatinine, Ser: 1 mg/dL (ref 0.4–1.5)
GFR: 84.19 mL/min (ref >60.00)
GLUCOSE: 94 mg/dL (ref 70–99)
POTASSIUM: 4.4 meq/L (ref 3.5–5.1)
SODIUM: 141 meq/L (ref 135–145)

## 2013-03-13 LAB — TSH: TSH: 1.96 u[IU]/mL (ref 0.35–5.50)

## 2013-03-14 ENCOUNTER — Encounter: Payer: Medicare Other | Admitting: Family Medicine

## 2013-03-17 ENCOUNTER — Other Ambulatory Visit: Payer: Medicare Other

## 2013-03-20 ENCOUNTER — Encounter: Payer: Self-pay | Admitting: Family Medicine

## 2013-03-20 ENCOUNTER — Ambulatory Visit (INDEPENDENT_AMBULATORY_CARE_PROVIDER_SITE_OTHER): Payer: Medicare Other | Admitting: Family Medicine

## 2013-03-20 VITALS — BP 124/80 | Temp 98.5°F | Ht 67.5 in | Wt 163.0 lb

## 2013-03-20 DIAGNOSIS — F528 Other sexual dysfunction not due to a substance or known physiological condition: Secondary | ICD-10-CM

## 2013-03-20 DIAGNOSIS — K219 Gastro-esophageal reflux disease without esophagitis: Secondary | ICD-10-CM

## 2013-03-20 DIAGNOSIS — Z0000 Encounter for general adult medical examination without abnormal findings: Secondary | ICD-10-CM

## 2013-03-20 DIAGNOSIS — E785 Hyperlipidemia, unspecified: Secondary | ICD-10-CM

## 2013-03-20 DIAGNOSIS — E7849 Other hyperlipidemia: Secondary | ICD-10-CM

## 2013-03-20 DIAGNOSIS — Z23 Encounter for immunization: Secondary | ICD-10-CM

## 2013-03-20 MED ORDER — OMEPRAZOLE 20 MG PO CPDR: 20.0000 mg | DELAYED_RELEASE_CAPSULE | Freq: Every day | ORAL | Status: DC

## 2013-03-20 MED ORDER — TADALAFIL 5 MG PO TABS: 5.0000 mg | ORAL_TABLET | Freq: Every day | ORAL | Status: DC | PRN

## 2013-03-20 MED ORDER — ATORVASTATIN CALCIUM 20 MG PO TABS: 20.0000 mg | ORAL_TABLET | Freq: Every day | ORAL | Status: DC

## 2013-03-20 NOTE — Progress Notes (Signed)
Pre visit review using our clinic review tool, if applicable. No additional management support is needed unless otherwise documented below in the visit note. 

## 2013-03-20 NOTE — Patient Instructions (Signed)
Continue your current medications  Restart the 81 mg baby aspirin if it's not causing bruising and/or causing GI upset  Return in one year sooner if any problems

## 2013-03-20 NOTE — Progress Notes (Signed)
   Subjective:    Patient ID: Bryce Bond, male    DOB: Dec 27, 1945, 68 y.o.   MRN: [id number]  HPI Bryce Bond is a 68 year old married male nonsmoker who comes in today for a Medicare wellness examination with a history of hyperlipidemia and Lipitor 20 mg daily. Stop the aspirin for now up. Reason except to stop that. Not having problem of bruising. Recommend he take the aspirin again  He's had a history of esophageal stricture he takes Prilosec 20 mg daily recent endoscopy in April 2014 by Dr. Henrene Pastor normal  Meals uses Cialis 5 mg when necessary for ED  He gets routine eye care, dental care, GI followup by Dr. Henrene Pastor,  Vaccinations updated by Apolonio Schneiders  Cognitive function normal he walks on regular basis home health safety reviewed no issues identified, no guns in the house, he does have a health care power of attorney and living well   Review of Systems  Constitutional: Negative.   HENT: Negative.   Eyes: Negative.   Respiratory: Negative.   Cardiovascular: Negative.   Gastrointestinal: Negative.   Genitourinary: Negative.   Musculoskeletal: Negative.   Skin: Negative.   Neurological: Negative.   Psychiatric/Behavioral: Negative.        Objective:   Physical Exam  Nursing note and vitals reviewed. Constitutional: He is oriented to person, place, and time. He appears well-developed and well-nourished.  HENT:  Head: Normocephalic and atraumatic.  Right Ear: External ear normal.  Left Ear: External ear normal.  Nose: Nose normal.  Mouth/Throat: Oropharynx is clear and moist.  Eyes: Conjunctivae and EOM are normal. Pupils are equal, round, and reactive to light.  Neck: Normal range of motion. Neck supple. No JVD present. No tracheal deviation present. No thyromegaly present.  Cardiovascular: Normal rate, regular rhythm, normal heart sounds and intact distal pulses.  Exam reveals no gallop and no friction rub.   No murmur heard. No carotid your Bruce pulses 2+ and symmetrical    Pulmonary/Chest: Effort normal and breath sounds normal. No stridor. No respiratory distress. He has no wheezes. He has no rales. He exhibits no tenderness.  Abdominal: Soft. Bowel sounds are normal. He exhibits no distension and no mass. There is no tenderness. There is no rebound and no guarding.  Genitourinary: Rectum normal, prostate normal and penis normal. Guaiac negative stool. No penile tenderness.  Musculoskeletal: Normal range of motion. He exhibits no edema and no tenderness.  Lymphadenopathy:    He has no cervical adenopathy.  Neurological: He is alert and oriented to person, place, and time. He has normal reflexes. No cranial nerve deficit. He exhibits normal muscle tone.  Skin: Skin is warm and dry. No rash noted. No erythema. No pallor.  Total body skin exam normal  Psychiatric: He has a normal mood and affect. His behavior is normal. Judgment and thought content normal.          Assessment & Plan:  Healthy male  Hyperlipidemia continue Lipitor repeat start the aspirin 81 mg daily  History of reflux with esophageal stricture how sick 20 mg daily  Erectile dysfunction Viagra 5 mg when necessary

## 2013-03-21 ENCOUNTER — Encounter: Payer: Self-pay | Admitting: Family Medicine

## 2013-03-22 MED ORDER — SILDENAFIL CITRATE 20 MG PO TABS: ORAL_TABLET | ORAL | Status: DC

## 2013-03-23 ENCOUNTER — Encounter: Payer: Medicare Other | Admitting: Family Medicine

## 2013-05-07 ENCOUNTER — Encounter: Payer: Self-pay | Admitting: Family Medicine

## 2013-06-05 ENCOUNTER — Encounter: Payer: Self-pay | Admitting: Family Medicine

## 2014-01-23 ENCOUNTER — Other Ambulatory Visit: Payer: Self-pay

## 2014-01-23 DIAGNOSIS — K219 Gastro-esophageal reflux disease without esophagitis: Secondary | ICD-10-CM

## 2014-01-23 DIAGNOSIS — E7849 Other hyperlipidemia: Secondary | ICD-10-CM

## 2014-01-23 MED ORDER — ATORVASTATIN CALCIUM 20 MG PO TABS: 20.0000 mg | ORAL_TABLET | Freq: Every day | ORAL | Status: DC

## 2014-01-23 MED ORDER — OMEPRAZOLE 20 MG PO CPDR: 20.0000 mg | DELAYED_RELEASE_CAPSULE | Freq: Every day | ORAL | Status: DC

## 2014-01-23 NOTE — Telephone Encounter (Signed)
Rx request for [facility] for atorvastatin and omeprazole.  Rx sent to pharmacy. Pt has upcoming appt in 3.2016

## 2014-01-26 ENCOUNTER — Encounter: Payer: Self-pay | Admitting: Family Medicine

## 2014-01-28 ENCOUNTER — Encounter: Payer: Self-pay | Admitting: Family Medicine

## 2014-01-28 DIAGNOSIS — E7849 Other hyperlipidemia: Secondary | ICD-10-CM

## 2014-01-28 DIAGNOSIS — K219 Gastro-esophageal reflux disease without esophagitis: Secondary | ICD-10-CM

## 2014-01-30 MED ORDER — OMEPRAZOLE 20 MG PO CPDR: 20.0000 mg | DELAYED_RELEASE_CAPSULE | Freq: Every day | ORAL | Status: DC

## 2014-01-30 MED ORDER — ATORVASTATIN CALCIUM 20 MG PO TABS: 20.0000 mg | ORAL_TABLET | Freq: Every day | ORAL | Status: DC

## 2014-03-01 DIAGNOSIS — H2513 Age-related nuclear cataract, bilateral: Secondary | ICD-10-CM | POA: Diagnosis not present

## 2014-03-06 ENCOUNTER — Encounter: Payer: Self-pay | Admitting: Internal Medicine

## 2014-03-29 ENCOUNTER — Other Ambulatory Visit (INDEPENDENT_AMBULATORY_CARE_PROVIDER_SITE_OTHER): Payer: Medicare Other

## 2014-03-29 DIAGNOSIS — Z79899 Other long term (current) drug therapy: Secondary | ICD-10-CM | POA: Diagnosis not present

## 2014-03-29 DIAGNOSIS — Z0000 Encounter for general adult medical examination without abnormal findings: Secondary | ICD-10-CM

## 2014-03-29 DIAGNOSIS — Z125 Encounter for screening for malignant neoplasm of prostate: Secondary | ICD-10-CM | POA: Diagnosis not present

## 2014-03-29 LAB — CBC WITH DIFFERENTIAL/PLATELET
Basophils Absolute: 0 10*3/uL (ref 0.0–0.1)
Basophils Relative: 0.6 % (ref 0.0–3.0)
EOS PCT: 1.5 % (ref 0.0–5.0)
Eosinophils Absolute: 0.1 10*3/uL (ref 0.0–0.7)
HCT: 43.2 % (ref 39.0–52.0)
Hemoglobin: 14.8 g/dL (ref 13.0–17.0)
Lymphocytes Relative: 25.4 % (ref 12.0–46.0)
Lymphs Abs: 1 10*3/uL (ref 0.7–4.0)
MCHC: 34.2 g/dL (ref 30.0–36.0)
MCV: 93.7 fl (ref 78.0–100.0)
MONO ABS: 0.4 10*3/uL (ref 0.1–1.0)
Monocytes Relative: 9.4 % (ref 3.0–12.0)
NEUTROS PCT: 63.1 % (ref 43.0–77.0)
Neutro Abs: 2.5 10*3/uL (ref 1.4–7.7)
PLATELETS: 208 10*3/uL (ref 150.0–400.0)
RBC: 4.62 Mil/uL (ref 4.22–5.81)
RDW: 13.1 % (ref 11.5–15.5)
WBC: 3.9 10*3/uL — ABNORMAL LOW (ref 4.0–10.5)

## 2014-03-29 LAB — BASIC METABOLIC PANEL
BUN: 18 mg/dL (ref 6–23)
CO2: 32 mEq/L (ref 19–32)
Calcium: 9.5 mg/dL (ref 8.4–10.5)
Chloride: 103 mEq/L (ref 96–112)
Creatinine, Ser: 0.92 mg/dL (ref 0.40–1.50)
GFR: 87.09 mL/min (ref >60.00)
GLUCOSE: 100 mg/dL — AB (ref 70–99)
Potassium: 4.7 mEq/L (ref 3.5–5.1)
Sodium: 138 mEq/L (ref 135–145)

## 2014-03-29 LAB — LIPID PANEL
CHOL/HDL RATIO: 3
Cholesterol: 131 mg/dL (ref 0–200)
HDL: 48.7 mg/dL (ref >39.00)
LDL CALC: 73 mg/dL (ref 0–99)
NonHDL: 82.3
TRIGLYCERIDES: 47 mg/dL (ref 0.0–149.0)
VLDL: 9.4 mg/dL (ref 0.0–40.0)

## 2014-03-29 LAB — POCT URINALYSIS DIPSTICK
Bilirubin, UA: NEGATIVE
GLUCOSE UA: NEGATIVE
Ketones, UA: NEGATIVE
Leukocytes, UA: NEGATIVE
NITRITE UA: NEGATIVE
PROTEIN UA: NEGATIVE
RBC UA: NEGATIVE
Spec Grav, UA: 1.015
Urobilinogen, UA: 0.2
pH, UA: 7.5

## 2014-03-29 LAB — HEPATIC FUNCTION PANEL
ALT: 28 U/L (ref 0–53)
AST: 29 U/L (ref 0–37)
Albumin: 4.3 g/dL (ref 3.5–5.2)
Alkaline Phosphatase: 60 U/L (ref 39–117)
BILIRUBIN TOTAL: 1.4 mg/dL — AB (ref 0.2–1.2)
Bilirubin, Direct: 0.3 mg/dL (ref 0.0–0.3)
TOTAL PROTEIN: 6.8 g/dL (ref 6.0–8.3)

## 2014-03-30 LAB — TSH: TSH: 2.01 u[IU]/mL (ref 0.35–4.50)

## 2014-03-30 LAB — PSA: PSA: 1.86 ng/mL (ref 0.10–4.00)

## 2014-04-05 ENCOUNTER — Ambulatory Visit (INDEPENDENT_AMBULATORY_CARE_PROVIDER_SITE_OTHER): Payer: Medicare Other | Admitting: Family Medicine

## 2014-04-05 ENCOUNTER — Encounter: Payer: Self-pay | Admitting: Family Medicine

## 2014-04-05 VITALS — BP 130/80 | Temp 98.3°F | Ht 67.5 in | Wt 156.0 lb

## 2014-04-05 DIAGNOSIS — F528 Other sexual dysfunction not due to a substance or known physiological condition: Secondary | ICD-10-CM

## 2014-04-05 DIAGNOSIS — K222 Esophageal obstruction: Secondary | ICD-10-CM

## 2014-04-05 DIAGNOSIS — K219 Gastro-esophageal reflux disease without esophagitis: Secondary | ICD-10-CM | POA: Diagnosis not present

## 2014-04-05 DIAGNOSIS — E785 Hyperlipidemia, unspecified: Secondary | ICD-10-CM

## 2014-04-05 MED ORDER — ATORVASTATIN CALCIUM 20 MG PO TABS: 20.0000 mg | ORAL_TABLET | Freq: Every day | ORAL | Status: DC

## 2014-04-05 MED ORDER — OMEPRAZOLE 20 MG PO CPDR: 20.0000 mg | DELAYED_RELEASE_CAPSULE | Freq: Every day | ORAL | Status: DC

## 2014-04-05 MED ORDER — SILDENAFIL CITRATE 20 MG PO TABS: ORAL_TABLET | ORAL | Status: DC

## 2014-04-05 NOTE — Progress Notes (Signed)
Pre visit review using our clinic review tool, if applicable. No additional management support is needed unless otherwise documented below in the visit note. 

## 2014-04-05 NOTE — Patient Instructions (Signed)
Continue current medications  Continue your good exercise diet and health habits  Follow-up in one year sooner if any problem

## 2014-04-05 NOTE — Progress Notes (Signed)
   Subjective:    Patient ID: Bryce Bond, male    DOB: 1945-04-21, 69 y.o.   MRN: [id number]  HPI Bryce Bond is a 69 year old married male nonsmoker who comes in today for general physical examination because of a history of hyperlipidemia, reflux esophagitis with a stricture, and a mild erectile dysfunction  He takes Lipitor 20 mg daily for hyperlipidemia lipids are at goal with an LDL 73  He takes Prilosec 20 mg daily. He has a history of reflux esophagitis and stricture. Currently no symptoms.  He uses the generic Viagra working well  He gets routine eye care, dental care, colonoscopy 2014 normal except for some diverticuli  Vaccinations up-to-date  Cognitive function normal he walks on a daily basis home health safety reviewed no issues identified, no guns in the house, he does have a healthcare power of attorney and living well   Review of Systems  Constitutional: Negative.   HENT: Negative.   Eyes: Negative.   Respiratory: Negative.   Cardiovascular: Negative.   Gastrointestinal: Negative.   Endocrine: Negative.   Genitourinary: Negative.   Musculoskeletal: Negative.   Skin: Negative.   Allergic/Immunologic: Negative.   Neurological: Negative.   Hematological: Negative.   Psychiatric/Behavioral: Negative.        Objective:   Physical Exam  Constitutional: He is oriented to person, place, and time. He appears well-developed and well-nourished.  HENT:  Head: Normocephalic and atraumatic.  Right Ear: External ear normal.  Left Ear: External ear normal.  Nose: Nose normal.  Mouth/Throat: Oropharynx is clear and moist.  Eyes: Conjunctivae and EOM are normal. Pupils are equal, round, and reactive to light.  Neck: Normal range of motion. Neck supple. No JVD present. No tracheal deviation present. No thyromegaly present.  Cardiovascular: Normal rate, regular rhythm, normal heart sounds and intact distal pulses.  Exam reveals no gallop and no friction rub.   No murmur  heard. Pulmonary/Chest: Effort normal and breath sounds normal. No stridor. No respiratory distress. He has no wheezes. He has no rales. He exhibits no tenderness.  Abdominal: Soft. Bowel sounds are normal. He exhibits no distension and no mass. There is no tenderness. There is no rebound and no guarding.  Genitourinary: Rectum normal, prostate normal and penis normal. Guaiac negative stool. No penile tenderness.  Musculoskeletal: Normal range of motion. He exhibits no edema or tenderness.  Lymphadenopathy:    He has no cervical adenopathy.  Neurological: He is alert and oriented to person, place, and time. He has normal reflexes. No cranial nerve deficit. He exhibits normal muscle tone.  Skin: Skin is warm and dry. No rash noted. No erythema. No pallor.  Psychiatric: He has a normal mood and affect. His behavior is normal. Judgment and thought content normal.  Nursing note and vitals reviewed.         Assessment & Plan:  Healthy male  History of hyperlipidemia continue Lipitor  History of reflux esophagitis with a stricture..... Currently asymptomatic.Marland Kitchen Continue Prilosec  Mild ED continue Viagra generic when necessary

## 2014-04-24 ENCOUNTER — Ambulatory Visit (INDEPENDENT_AMBULATORY_CARE_PROVIDER_SITE_OTHER): Payer: Medicare Other | Admitting: Internal Medicine

## 2014-04-24 ENCOUNTER — Encounter: Payer: Self-pay | Admitting: Internal Medicine

## 2014-04-24 VITALS — BP 132/68 | HR 64 | Ht 67.5 in | Wt 160.1 lb

## 2014-04-24 DIAGNOSIS — K219 Gastro-esophageal reflux disease without esophagitis: Secondary | ICD-10-CM | POA: Diagnosis not present

## 2014-04-24 NOTE — Patient Instructions (Signed)
Please follow up with Dr. Perry in one year 

## 2014-04-24 NOTE — Progress Notes (Signed)
HISTORY OF PRESENT ILLNESS:  Bryce Bond is a 69 y.o. male with a history of GERD complicated by erosive esophagitis and peptic stricture requiring esophageal dilation. He also has a history of adenomatous colon polyps and is undergone multiple previous colonoscopies. The patient was last evaluated in the office 11/16/2009 for GERD. At that time an extensive discussion regarding PPI related issues and alternative therapies explored. He was to follow-up in 2 years but did not. His last surveillance colonoscopy was performed April 2014. Severe diverticulosis noted. Otherwise negative. Follow-up in 5 years due to a history of advanced neoplasia. The patient read recent reports regarding a possible connection between chronic PPI use and cognitive impairment. He has relatives with a history of cognitive impairment. He wishes to get off PPI for possible. He has tried every other day therapy and does fairly well. Beyond that, significant recurrent symptoms. He has not had recurrent dysphagia. Other complaints. Review of outside laboratories find unremarkable CBC and comprehensive metabolic panel with normal creatinine.  REVIEW OF SYSTEMS:  All non-GI ROS negative except for sinus allergy trouble  Past Medical History  Diagnosis Date  . Allergy   . GERD (gastroesophageal reflux disease)   . Hyperlipidemia   . Colon polyps   . Erosive esophagitis   . Diverticulosis   . Esophageal stricture   . Irritable bowel syndrome     Past Surgical History  Procedure Laterality Date  . Tonsillectomy  1965    Social History Bryce Bond  reports that he has never smoked. He has never used smokeless tobacco. He reports that he drinks alcohol. He reports that he does not use illicit drugs.  family history includes Coronary artery disease in his sister and sister; Diabetes in his father; Gallbladder disease in his father and sister; Heart attack in his father. There is no history of Colon cancer, Colon  polyps, Kidney disease, or Esophageal cancer.  No Known Allergies     PHYSICAL EXAMINATION: Vital signs: BP 132/68 mmHg  Pulse 64  Ht 5' 7.5" (1.715 m)  Wt 160 lb 2 oz (72.632 kg)  BMI 24.69 kg/m2 General: Well-developed, well-nourished, no acute distress HEENT: Sclerae are anicteric, conjunctiva pink. Oral mucosa intact Lungs: Clear Heart: Regular Abdomen: soft, nontender, nondistended, no obvious ascites, no peritoneal signs, normal bowel sounds. No organomegaly. Extremities: No edema Psychiatric: alert and oriented x3. Cooperative     ASSESSMENT:  #1. GERD complicated by erosive esophagitis and peptic stricture. We discussed current knowledge regarding PPIs and potential side effects. Recently discussed [facility] meeting. Discussed that there is no firm evidence that PPIs cause cognitive impairment. However, this should be monitored carefully. Also discussed the most important issue which is indication for PPI therapy. In this particular patient with a history of significant esophageal damage (esophagitis and stricture) therapy is justified. I also discussed with him alternatives to PPI, namely high-dose H2 receptor antagonist twice daily. To this end, we have agreed to continue on omeprazole 20 mg daily. He will follow-up in the office in 1 year and we can readdress the issue, particularly if no information available. He is comfortable with this approach  #2. History of adenomatous colon polyps. Last colonoscopy April 2014. Plan surveillance around April 2019.  25 minutes spent face-to-face interaction in counseling of this patient

## 2014-08-03 ENCOUNTER — Encounter: Payer: Self-pay | Admitting: Internal Medicine

## 2014-10-11 DIAGNOSIS — D225 Melanocytic nevi of trunk: Secondary | ICD-10-CM | POA: Diagnosis not present

## 2014-10-11 DIAGNOSIS — L812 Freckles: Secondary | ICD-10-CM | POA: Diagnosis not present

## 2014-10-11 DIAGNOSIS — L821 Other seborrheic keratosis: Secondary | ICD-10-CM | POA: Diagnosis not present

## 2014-10-11 DIAGNOSIS — L738 Other specified follicular disorders: Secondary | ICD-10-CM | POA: Diagnosis not present

## 2014-10-11 DIAGNOSIS — D1801 Hemangioma of skin and subcutaneous tissue: Secondary | ICD-10-CM | POA: Diagnosis not present

## 2014-12-27 ENCOUNTER — Other Ambulatory Visit: Payer: Self-pay | Admitting: Family Medicine

## 2015-01-16 DIAGNOSIS — L814 Other melanin hyperpigmentation: Secondary | ICD-10-CM | POA: Diagnosis not present

## 2015-01-30 ENCOUNTER — Encounter: Payer: Self-pay | Admitting: Family Medicine

## 2015-04-02 ENCOUNTER — Other Ambulatory Visit: Payer: Medicare Other

## 2015-04-09 ENCOUNTER — Encounter: Payer: Medicare Other | Admitting: Family Medicine

## 2015-04-12 ENCOUNTER — Telehealth: Payer: Self-pay | Admitting: Internal Medicine

## 2015-04-15 NOTE — Telephone Encounter (Signed)
We discussed this last year. Complicated topic. We agreed continue medication and follow-up in the office in 1 year. Have him make a follow-up office appointment at his convenience and we can readdress these issues. Thanks

## 2015-04-15 NOTE — Telephone Encounter (Signed)
Pt states that he has been taking Prilosec 20mg  daily for many years now but is concerned about the possible side effects. Pt would like to get Dr. Blanch Media advice on how to taper off of the prilosec and wanted to know his thoughts on taking maybe pepcid or tagamet once off prilosec. Please advise.

## 2015-04-15 NOTE — Telephone Encounter (Signed)
Pt aware and scheduled to see Dr. Henrene Pastor 05/29/15@9am . Pt aware of appt.

## 2015-05-28 ENCOUNTER — Other Ambulatory Visit: Payer: Medicare Other

## 2015-05-29 ENCOUNTER — Encounter: Payer: Self-pay | Admitting: Internal Medicine

## 2015-05-29 ENCOUNTER — Ambulatory Visit (INDEPENDENT_AMBULATORY_CARE_PROVIDER_SITE_OTHER): Payer: Medicare HMO | Admitting: Internal Medicine

## 2015-05-29 VITALS — BP 120/70 | HR 72 | Ht 67.5 in | Wt 153.8 lb

## 2015-05-29 DIAGNOSIS — R110 Nausea: Secondary | ICD-10-CM | POA: Diagnosis not present

## 2015-05-29 DIAGNOSIS — K2100 Gastro-esophageal reflux disease with esophagitis, without bleeding: Secondary | ICD-10-CM

## 2015-05-29 DIAGNOSIS — K222 Esophageal obstruction: Secondary | ICD-10-CM

## 2015-05-29 DIAGNOSIS — Z1211 Encounter for screening for malignant neoplasm of colon: Secondary | ICD-10-CM | POA: Diagnosis not present

## 2015-05-29 DIAGNOSIS — Z86010 Personal history of colonic polyps: Secondary | ICD-10-CM

## 2015-05-29 NOTE — Progress Notes (Signed)
HISTORY OF PRESENT ILLNESS:  Bryce Bond is a 70 y.o. male with GERD complicated by erosive esophagitis and peptic stricture requiring esophageal dilation. He also has a history of adenomatous colon polyps and is undergone multiple previous colonoscopies. The patient was last evaluated in the office 04/24/2014 with questions regarding chronic PPI use. Please see that dictation for details. He presents today again with questions regarding chronic PPI use. As well, to report issues with vague nausea. In terms of nausea, he notices sometimes at night upon awakening. Also sometimes in the early morning or when lying prone doing exercises. The problem dissipates quickly without requiring treatment or significant attention. During the day he will notice a little bit of food seems to help. There has been no vomiting. No neurologic symptoms including headache. He has had mild weight loss which is directly attributed to intent with diet and exercise. No dysphagia. He denies classic reflux symptoms. Continues on 20 mg of omeprazole daily. No lower GI complaints. Surveillance colonoscopy up-to-date.  REVIEW OF SYSTEMS:  All non-GI ROS negative upon comprehensive review  Past Medical History  Diagnosis Date  . Allergy   . GERD (gastroesophageal reflux disease)   . Hyperlipidemia   . Colon polyps   . Erosive esophagitis   . Diverticulosis   . Esophageal stricture   . Irritable bowel syndrome     Past Surgical History  Procedure Laterality Date  . Tonsillectomy  1965    Social History Bryce Bond  reports that he has never smoked. He has never used smokeless tobacco. He reports that he drinks alcohol. He reports that he does not use illicit drugs.  family history includes Coronary artery disease in his sister and sister; Diabetes in his father; Gallbladder disease in his father and sister; Heart attack in his father. There is no history of Colon cancer, Colon polyps, Kidney disease, or  Esophageal cancer.  No Known Allergies     PHYSICAL EXAMINATION: Vital signs: BP 120/70 mmHg  Pulse 72  Ht 5' 7.5" (1.715 m)  Wt 153 lb 12.8 oz (69.763 kg)  BMI 23.72 kg/m2  Constitutional: generally well-appearing, no acute distress Psychiatric: alert and oriented x3, cooperative Eyes: extraocular movements intact, anicteric, conjunctiva pink Mouth: oral pharynx moist, no lesions. No thrush Neck: supple no lymphadenopathy Cardiovascular: heart regular rate and rhythm, no murmur Lungs: clear to auscultation bilaterally Abdomen: soft, nontender, nondistended, no obvious ascites, no peritoneal signs, normal bowel sounds, no organomegaly Rectal:Omitted Extremities: no clubbing cyanosis or lower extremity edema bilaterally Skin: no lesions on visible extremities Neuro: No focal deficits. Cranial nerves intact. Normal DTRs. No asterixis.   ASSESSMENT:  #1. GERD with a history of severe erosive esophagitis and peptic stricture requiring esophageal dilation. Doing well on low-dose PPI post dilation. We again discussed all the currently available information regarding PPI use and the multiple concerns raised regarding long-term use. We discussed the limitations of this data in relationship to his disease. The discussion was long, detailed, with multiple questions answered satisfactorily #2. History of adenomatous colon polyps. Last colonoscopy April 2014. #3. Mild vague nausea without alarm features. Recommend observation at this point   PLAN:  #1. Reflux precautions #2. Continue omeprazole 20 mg daily (after extended discussion he agrees) #3. Routine office follow-up one year. Contact the office in the interim for worsening reflux, recurrent dysphagia, questions, or worsening nausea issues #4. Surveillance colonoscopy around April 2019  25 minutes spent face-to-face with the patient. Greater than 50% a time use for counseling  regarding GERD, issues with PPI, and assessment of  nausea

## 2015-05-29 NOTE — Patient Instructions (Signed)
Please follow up as needed 

## 2015-06-03 ENCOUNTER — Encounter: Payer: Medicare Other | Admitting: Family Medicine

## 2015-08-17 ENCOUNTER — Encounter: Payer: Self-pay | Admitting: Family Medicine

## 2017-05-26 ENCOUNTER — Encounter: Payer: Self-pay | Admitting: Internal Medicine

## 2020-10-02 ENCOUNTER — Encounter: Payer: Self-pay | Admitting: Allergy and Immunology

## 2020-10-02 ENCOUNTER — Other Ambulatory Visit: Payer: Self-pay

## 2020-10-02 ENCOUNTER — Ambulatory Visit: Payer: Medicare HMO | Admitting: Allergy and Immunology

## 2020-10-02 VITALS — BP 148/70 | HR 59 | Temp 98.1°F | Resp 21 | Ht 68.0 in | Wt 144.0 lb

## 2020-10-02 DIAGNOSIS — K219 Gastro-esophageal reflux disease without esophagitis: Secondary | ICD-10-CM | POA: Diagnosis not present

## 2020-10-02 DIAGNOSIS — H6983 Other specified disorders of Eustachian tube, bilateral: Secondary | ICD-10-CM | POA: Diagnosis not present

## 2020-10-02 DIAGNOSIS — J3089 Other allergic rhinitis: Secondary | ICD-10-CM | POA: Diagnosis not present

## 2020-10-02 DIAGNOSIS — H918X3 Other specified hearing loss, bilateral: Secondary | ICD-10-CM | POA: Diagnosis not present

## 2020-10-02 DIAGNOSIS — H6993 Unspecified Eustachian tube disorder, bilateral: Secondary | ICD-10-CM

## 2020-10-02 MED ORDER — FAMOTIDINE 40 MG PO TABS: 40.0000 mg | ORAL_TABLET | Freq: Every day | ORAL | 5 refills | Status: AC

## 2020-10-02 MED ORDER — MONTELUKAST SODIUM 10 MG PO TABS: 10.0000 mg | ORAL_TABLET | Freq: Every day | ORAL | 5 refills | Status: AC

## 2020-10-02 MED ORDER — OMEPRAZOLE 40 MG PO CPDR: 40.0000 mg | DELAYED_RELEASE_CAPSULE | Freq: Every morning | ORAL | 5 refills | Status: AC

## 2020-10-02 NOTE — Patient Instructions (Addendum)
  1.  Allergen avoidance measures - mold  2.  Treat and prevent inflammation:  A. OTC Nasacort - 2 sprays each nostril 3-7 times per week B. Montelukast 10 mg - 1 tablet 1 time per day  3.  Treat and prevent reflux/LPR:  A. Slowly consolidate caffeine consumption B. Increase omeprazole to 40 mg in AM C. Start famotidine 40 mg in PM  4.  If needed:  A. Nasal saline B. OTC antihistamine  5.  Evaluation of hearing and tympanic membranes with ENT  6.  Plan for fall flu vaccine  7.  Return to clinic in 8 weeks or earlier if problem

## 2020-10-02 NOTE — Progress Notes (Signed)
[place] [facility] [place]   Dear Dr. Clyda Greener,  Thank you for referring Bryce Bond to the [facility] on 10/02/2020.   Below is a summation of this patient's evaluation and recommendations.  Thank you for your referral. I will keep you informed about this patient's response to treatment.   If you have any questions please do not hesitate to contact me.   Sincerely,  Jiles Prows, MD Allergy / Immunology [place]   ______________________________________________________________________    NEW PATIENT NOTE  Referring Provider: Maryruth Eve, MD Primary Provider: [name], FNP Date of office visit: 10/02/2020    Subjective:   Chief Complaint:  Bryce Bond (DOB: 12-Sep-1945) is a 75 y.o. male who presents to the clinic on 10/02/2020 with a chief complaint of Allergies (Ear fullness, sore throat, sinus headaches, nasal congestion x 1 year.) .     HPI: Bryce Bond presents to this clinic in evaluation of allergic disease.  Bryce Bond has a several year history of having some seasonal allergies with some occasional nasal congestion and some sneezing and some mucus production which was not a particularly big issue until he moved from the [place] area about 2 years ago.  Over the course of the past 2 years he has had several different issues develop.  First, he has noticed that his hearing is not as good as it was in the past.  He has had a decrease in his ability to hear clearly.  He does have hearing aids which amplifies the sounds from the environment but they are still not very clear and he feels like he is "underwater".  His last hearing test was about 4 years ago.  Second, he has some intermittent sore throat and feeling as though the glands in his throat are little bit swollen.  As well he has some postnasal drip and some throat clearing as  though there is a tickle in his throat.  He does have reflux disease with heartburn up to his throat that is treated successfully with low-dose omeprazole.  He drinks 2 coffees in the morning.  He has tried Claritin and Zyrtec for this issue and has used nasal saline spray which has not helped.  Third, he appears to have a occasional sneeze and very little nasal congestion and no anosmia and no significant headaches.  He believes a provoking factor for some of his respiratory tract symptoms and his ear issues his exposure to the barn.  Inside the barn is a horse and a donkey and hay.  Past Medical History:  Diagnosis Date   Allergy    Colon polyps    Diverticulosis    Erosive esophagitis    Esophageal stricture    GERD (gastroesophageal reflux disease)    Hyperlipidemia    Irritable bowel syndrome     Past Surgical History:  Procedure Laterality Date   TONSILLECTOMY  1965    Allergies as of 10/02/2020   No Known Allergies      Medication List    atorvastatin 10 MG tablet Commonly known as: LIPITOR atorvastatin 10 mg tablet   1 tablet every day by oral route.   chlorhexidine 0.12 % solution Commonly known as: PERIDEX 15 mLs 2 (two) times daily.   co-enzyme Q-10 30 MG capsule Take 100 mg by mouth daily.   Cyanocobalamin 500 MCG Subl B12   omeprazole 20 MG  capsule Commonly known as: PRILOSEC Take 1 capsule by mouth  daily        Review of systems negative except as noted in HPI / PMHx or noted below:  Review of Systems  Constitutional: Negative.   HENT: Negative.    Eyes: Negative.   Respiratory: Negative.    Cardiovascular: Negative.   Gastrointestinal: Negative.   Genitourinary: Negative.   Musculoskeletal: Negative.   Skin: Negative.   Neurological: Negative.   Endo/Heme/Allergies: Negative.   Psychiatric/Behavioral: Negative.     Family History  Problem Relation Age of Onset   Heart attack Father    Diabetes Father    Gallbladder disease  Father    Coronary artery disease Sister    Gallbladder disease Sister    Coronary artery disease Sister    Asthma Daughter    Colon cancer Neg Hx    Colon polyps Neg Hx    Kidney disease Neg Hx    Esophageal cancer Neg Hx    Allergic rhinitis Neg Hx    Eczema Neg Hx    Immunodeficiency Neg Hx    Urticaria Neg Hx    Angioedema Neg Hx     Social History   Socioeconomic History   Marital status: Married    Spouse name: Not on file   Number of children: 2   Years of education: Not on file   Highest education level: Not on file  Occupational History   Occupation: Retired  Tobacco Use   Smoking status: Never   Smokeless tobacco: Never  Vaping Use   Vaping Use: Never used  Substance and Sexual Activity   Alcohol use: Yes    Comment: Occassional wine   Drug use: No   Sexual activity: Not on file  Other Topics Concern   Not on file  Social History Narrative   Not on file   Environmental and Social history  Lives in a house with a dry [place], cats and dogs look inside the household, horses and donkeys located in the barn, hardwood in the bedroom, plastic on the bed, plastic on the pillow, no smoking ongoing with inside the household.  Objective:   Vitals:   10/02/20 0846  BP: (!) 148/70  Pulse: (!) 59  Resp: (!) 21  Temp: 98.1 F (36.7 C)  SpO2: 99%   Height: '5\' 8"'$  (172.7 cm) Weight: 144 lb (65.3 kg)  Physical Exam Constitutional:      Appearance: He is not diaphoretic.  HENT:     Head: Normocephalic.     Right Ear: Ear canal and external ear normal. 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     Left Ear: Ear canal and external ear normal. 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     Nose: Nose normal. No mucosal edema or rhinorrhea.     Mouth/Throat:     Pharynx: Uvula midline. No oropharyngeal exudate.  Eyes:     Conjunctiva/sclera: Conjunctivae normal.  Neck:     Thyroid: No thyromegaly.     Trachea: Trachea normal. No tracheal  tenderness or tracheal deviation.  Cardiovascular:     Rate and Rhythm: Normal rate and regular rhythm.     Heart sounds: Normal heart sounds, S1 normal and S2 normal. No murmur heard. Pulmonary:     Effort: No respiratory distress.     Breath sounds: Normal breath sounds. No stridor. No wheezing or rales.  Lymphadenopathy:     Head:     Right side of head: No tonsillar adenopathy.  Left side of head: No tonsillar adenopathy.     Cervical: No cervical adenopathy.  Skin:    Findings: No erythema or rash.     Nails: There is no clubbing.  Neurological:     Mental Status: He is alert.    Diagnostics: Allergy skin tests were performed.  He demonstrated hypersensitivity to mold.  Assessment and Plan:    1. Perennial allergic rhinitis   2. LPRD (laryngopharyngeal reflux disease)   3. Other specified hearing loss of both ears   4. Dysfunction of both eustachian tubes     1.  Allergen avoidance measures - mold  2.  Treat and prevent inflammation:  A. OTC Nasacort - 2 sprays each nostril 3-7 times per week B. Montelukast 10 mg - 1 tablet 1 time per day  3.  Treat and prevent reflux/LPR:  A. Slowly consolidate caffeine consumption B. Increase omeprazole to 40 mg in AM C. Start famotidine 40 mg in PM  4.  If needed:  A. Nasal saline B. OTC antihistamine  5.  Evaluation of hearing and tympanic membranes with ENT  6.  Plan for fall flu vaccine  7.  Return to clinic in 8 weeks or earlier if problem  Bryce Bond appears to have some significant issues giving rise to upper airway symptoms, throat issues, and hearing issues.  I am going to attempt to have him utilize anti-inflammatory agents for his airway with the hope that this helps function of his eustachian tubes and helps his hearing loss.  As well, there is appears to be a component of LPR for which we will get him to utilize a plan noted above.  I would like for him to have his hearing formally tested and to examine the  health of his tympanic membranes and eustachian tube function.  We will get that arranged some point in the near future.  I will see him back in this clinic in 8 weeks or earlier if there is a problem.  Jiles Prows, MD Allergy / Immunology [facility]

## 2020-10-03 ENCOUNTER — Encounter: Payer: Self-pay | Admitting: Allergy and Immunology

## 2020-10-06 ENCOUNTER — Encounter: Payer: Self-pay | Admitting: Allergy and Immunology

## 2020-10-07 ENCOUNTER — Telehealth: Payer: Self-pay | Admitting: *Deleted

## 2020-10-07 ENCOUNTER — Encounter: Payer: Self-pay | Admitting: Allergy and Immunology

## 2020-10-07 DIAGNOSIS — H918X3 Other specified hearing loss, bilateral: Secondary | ICD-10-CM

## 2020-10-07 DIAGNOSIS — H6983 Other specified disorders of Eustachian tube, bilateral: Secondary | ICD-10-CM

## 2020-10-07 NOTE — Telephone Encounter (Signed)
Per Dr. Neldon Mc place Evaluation of hearing and tympanic membranes with ENT [facility] [place], [facility] [address], [place] [address] [phone] [phone]  Please advise.

## 2020-11-28 ENCOUNTER — Ambulatory Visit: Payer: Medicare HMO | Admitting: Allergy and Immunology

## 2020-11-28 ENCOUNTER — Encounter: Payer: Self-pay | Admitting: Allergy and Immunology

## 2020-11-28 ENCOUNTER — Other Ambulatory Visit: Payer: Self-pay

## 2020-11-28 VITALS — BP 122/70 | HR 60 | Resp 16

## 2020-11-28 DIAGNOSIS — K219 Gastro-esophageal reflux disease without esophagitis: Secondary | ICD-10-CM | POA: Diagnosis not present

## 2020-11-28 DIAGNOSIS — H918X3 Other specified hearing loss, bilateral: Secondary | ICD-10-CM

## 2020-11-28 DIAGNOSIS — J3089 Other allergic rhinitis: Secondary | ICD-10-CM

## 2020-11-28 NOTE — Patient Instructions (Signed)
  1.  Continue to perform allergen avoidance measures - mold  2.  Continue to treat and prevent inflammation:  A. OTC Nasacort - 2 sprays each nostril 3-7 times per week  3.  If you would like to treat reflux/LPR use the following:   A. Slowly consolidate caffeine consumption B. Increase omeprazole to 40 mg in AM C. Start famotidine 40 mg in PM  4.  If needed:  A. Nasal saline B. OTC antihistamine  5.  Return to clinic in 6 months or earlier if problem

## 2020-11-28 NOTE — Progress Notes (Signed)
[place]   Follow-up Note  Referring Provider: [name], FNP Primary Provider: [name], FNP Date of Office Visit: 11/28/2020  Subjective:   Bryce Bond (DOB: 1945/08/02) is a 75 y.o. male who returns to the [facility] on 11/28/2020 in re-evaluation of the following:  HPI: Bryce Bond returns to this clinic in evaluation of allergic rhinitis, LPR, and a problem with his hearing.  We last saw him in this clinic during his initial evaluation of 02 October 2020.  He has not been particularly good about using the plan prescribed during his last visit.  He has been using his Nasacort on a pretty consistent basis and he has noticed some improvement in that his head feels clearer.  He still has a sensation of some fullness in his ears.  He did visit with ENT who recommended that he use a new set of hearing aids for his high-frequency hearing loss.  He does not use montelukast secondary to the FDA warning about CNS side effects.  He thinks that there is some improvement regarding his intermittent sore throat and postnasal drip and throat clearing and tickle in his throat.  He has decreased caffeine and is only drinking a half caffeinated coffee in the morning.  He did not increase his omeprazole or start famotidine.  Allergies as of 11/28/2020   No Known Allergies      Medication List    atorvastatin 10 MG tablet Commonly known as: LIPITOR atorvastatin 10 mg tablet   1 tablet every day by oral route.   chlorhexidine 0.12 % solution Commonly known as: PERIDEX 15 mLs 2 (two) times daily.   co-enzyme Q-10 30 MG capsule Take 100 mg by mouth daily.   Cyanocobalamin 500 MCG Subl B12   famotidine 40 MG tablet Commonly known as: PEPCID Take 1 tablet (40 mg total) by mouth at bedtime.   levocetirizine 5 MG tablet Commonly known as: XYZAL Take 5 mg by mouth every evening.   montelukast 10 MG  tablet Commonly known as: Singulair Take 1 tablet (10 mg total) by mouth at bedtime.   Nasacort Allergy 24HR 55 MCG/ACT Aero nasal inhaler Generic drug: triamcinolone Place 1 spray into the nose daily.   omeprazole 40 MG capsule Commonly known as: PRILOSEC Take 1 capsule (40 mg total) by mouth every morning.    Past Medical History:  Diagnosis Date   Allergy    Colon polyps    Diverticulosis    Erosive esophagitis    Esophageal stricture    GERD (gastroesophageal reflux disease)    Hyperlipidemia    Irritable bowel syndrome     Past Surgical History:  Procedure Laterality Date   TONSILLECTOMY  1965    Review of systems negative except as noted in HPI / PMHx or noted below:  Review of Systems  Constitutional: Negative.   HENT: Negative.    Eyes: Negative.   Respiratory: Negative.    Cardiovascular: Negative.   Gastrointestinal: Negative.   Genitourinary: Negative.   Musculoskeletal: Negative.   Skin: Negative.   Neurological: Negative.   Endo/Heme/Allergies: Negative.   Psychiatric/Behavioral: Negative.      Objective:   Vitals:   11/28/20 0809  BP: 122/70  Pulse: 60  Resp: 16  SpO2: 98%          Physical Exam Constitutional:      Appearance: He is not diaphoretic.  HENT:     Head: Normocephalic.  Right Ear: Tympanic membrane, ear canal and external ear normal.     Left Ear: Tympanic membrane, ear canal and external ear normal.     Nose: Nose normal. No mucosal edema or rhinorrhea.     Mouth/Throat:     Pharynx: Uvula midline. No oropharyngeal exudate.  Eyes:     Conjunctiva/sclera: Conjunctivae normal.  Neck:     Thyroid: No thyromegaly.     Trachea: Trachea normal. No tracheal tenderness or tracheal deviation.  Cardiovascular:     Rate and Rhythm: Normal rate and regular rhythm.     Heart sounds: Normal heart sounds, S1 normal and S2 normal. No murmur heard. Pulmonary:     Effort: No respiratory distress.     Breath sounds: Normal  breath sounds. No stridor. No wheezing or rales.  Lymphadenopathy:     Head:     Right side of head: No tonsillar adenopathy.     Left side of head: No tonsillar adenopathy.     Cervical: No cervical adenopathy.  Skin:    Findings: No erythema or rash.     Nails: There is no clubbing.  Neurological:     Mental Status: He is alert.    Diagnostics: none  Assessment and Plan:   1. Perennial allergic rhinitis   2. LPRD (laryngopharyngeal reflux disease)   3. Other specified hearing loss of both ears     1.  Continue to perform allergen avoidance measures - mold  2.  Continue to treat and prevent inflammation:  A. OTC Nasacort - 2 sprays each nostril 3-7 times per week  3.  If you would like to treat reflux/LPR use the following:   A. Slowly consolidate caffeine consumption B. Increase omeprazole to 40 mg in AM C. Start famotidine 40 mg in PM  4.  If needed:  A. Nasal saline B. OTC antihistamine  5.  Return to clinic in 6 months or earlier if problem  We will have Cando continue to utilize anti-inflammatory agents for his airway and he has the option of starting his therapy for LPR as noted above.  He can follow-up regarding his hearing loss with ENT.  I will see him back in his clinic in 6 months or earlier if there is a problem.  Bryce Katz, MD Allergy / Immunology [place]

## 2020-12-02 ENCOUNTER — Encounter: Payer: Self-pay | Admitting: Allergy and Immunology

## 2021-09-02 ENCOUNTER — Telehealth: Payer: Self-pay | Admitting: Internal Medicine

## 2021-09-02 NOTE — Telephone Encounter (Signed)
Hi Dr. Henrene Pastor,   Patient called requesting a transfer of care back to you from [facility]. Records were obtained for you to review and advise on scheduling.   Thank you

## 2021-09-03 NOTE — Telephone Encounter (Signed)
GI RECORDS REVIEWED  Okay for this patient to return to my care.  I will give the medical records to [facility], in case he schedules an office visit. Thanks.  Docia Chuck. Geri Seminole., M.D. [facility] Division of Gastroenterology

## 2021-09-04 ENCOUNTER — Encounter: Payer: Self-pay | Admitting: Internal Medicine

## 2021-09-04 NOTE — Telephone Encounter (Signed)
I scheduled patient for 10/29/21 records were sent to be given to you to file. Thanks

## 2021-10-29 ENCOUNTER — Ambulatory Visit: Payer: Medicare HMO | Admitting: Internal Medicine

## 2021-10-29 ENCOUNTER — Encounter: Payer: Self-pay | Admitting: Internal Medicine

## 2021-10-29 VITALS — BP 134/70 | HR 74 | Ht 68.0 in | Wt 135.0 lb

## 2021-10-29 DIAGNOSIS — K2100 Gastro-esophageal reflux disease with esophagitis, without bleeding: Secondary | ICD-10-CM

## 2021-10-29 DIAGNOSIS — Z86010 Personal history of colonic polyps: Secondary | ICD-10-CM | POA: Diagnosis not present

## 2021-10-29 NOTE — Progress Notes (Signed)
HISTORY OF PRESENT ILLNESS:  Bryce Bond is a 76 y.o. male with a history of multiple advanced adenomatous colon polyps as well as GERD who presents today to reestablish GI care.  I performed colonoscopy on the patient in 2001, 2004, 2009, and 2014.  His most recent examination revealed left-sided diverticulosis only.  He also has a history of GERD with esophagitis for which she had been on PPI.  He subsequently moved to the [place] area.  Outside records reviewed.  In 2019 he went for his routine surveillance colonoscopy.  At that time he had been off PPI and having dyspeptic/GERD symptoms.  He also underwent upper endoscopy at that time.  Colonoscopy revealed adenomatous colon polyps and diverticulosis.  Follow-up in 5 years recommended.  Upper endoscopy revealed esophagitis for which she returned to taking his omeprazole 20 mg daily.  He reports good control of reflux symptoms on medication.  No dysphagia.  GI review of systems is otherwise negative.  REVIEW OF SYSTEMS:  All non-GI ROS negative unless otherwise stated in the HPI except for allergies, hearing problems, excessive urination  Past Medical History:  Diagnosis Date   Allergy    Colon polyps    Diverticulosis    Erosive esophagitis    Esophageal stricture    GERD (gastroesophageal reflux disease)    Hyperlipidemia    Irritable bowel syndrome     Past Surgical History:  Procedure Laterality Date   HERNIA REPAIR     TONSILLECTOMY  01/20/1963    Social History Bryce Bond  reports that he has never smoked. He has never used smokeless tobacco. He reports current alcohol use. He reports that he does not use drugs.  family history includes Asthma in his daughter; Coronary artery disease in his sister and sister; Diabetes in his father; Gallbladder disease in his father and sister; Heart attack in his father.  No Known Allergies     PHYSICAL EXAMINATION: Vital signs: BP 134/70   Pulse 74   Ht '5\' 8"'$  (1.727  m)   Wt 135 lb (61.2 kg)   SpO2 99%   BMI 20.53 kg/m   Constitutional: generally well-appearing, no acute distress Psychiatric: alert and oriented x3, cooperative Eyes: extraocular movements intact, anicteric, conjunctiva pink Mouth: oral pharynx moist, no lesions Neck: supple no lymphadenopathy Cardiovascular: heart regular rate and rhythm, no murmur Lungs: clear to auscultation bilaterally Abdomen: soft, nontender, nondistended, no obvious ascites, no peritoneal signs, normal bowel sounds, no organomegaly Rectal: Omitted Extremities: no clubbing, cyanosis, or lower extremity edema bilaterally Skin: no lesions on visible extremities Neuro: No focal deficits.  Cranial nerves intact  ASSESSMENT:  1.  Personal history of multiple and advanced adenomatous colon polyps.  Multiple prior colonoscopies.  Most recent examination elsewhere April 2019. 2.  Diverticulosis 3.  GERD with esophagitis.  Symptoms managed with omeprazole 20 mg daily   PLAN:  1.  Reflux precautions 2.  Continue omeprazole 20 mg daily 3.  Surveillance colonoscopy around April 2024.  Recall placed. 4.  Interval follow-up as needed

## 2021-10-29 NOTE — Patient Instructions (Signed)
_______________________________________________________  If you are age 75 or older, your body mass index should be between 23-30. Your Body mass index is 20.53 kg/m. If this is out of the aforementioned range listed, please consider follow up with your Primary Care Provider.  If you are age 29 or younger, your body mass index should be between 19-25. Your Body mass index is 20.53 kg/m. If this is out of the aformentioned range listed, please consider follow up with your Primary Care Provider.   ________________________________________________________  The [facility] providers would like to encourage you to use [facility] to communicate with providers for non-urgent requests or questions.  Due to long hold times on the telephone, sending your provider a message by [facility] may be a faster and more efficient way to get a response.  Please allow 48 business hours for a response.  Please remember that this is for non-urgent requests.  _______________________________________________________  Dennis Bast are do for a recall colonoscopy in April, 2024  You will receive a letter reminding you to call and schedule this

## 2022-02-23 ENCOUNTER — Encounter: Payer: Self-pay | Admitting: Internal Medicine

## 2022-02-23 ENCOUNTER — Telehealth: Payer: Self-pay | Admitting: Internal Medicine

## 2022-02-23 ENCOUNTER — Other Ambulatory Visit: Payer: Self-pay

## 2022-02-23 DIAGNOSIS — R1013 Epigastric pain: Secondary | ICD-10-CM

## 2022-02-23 MED ORDER — OMEPRAZOLE 40 MG PO CPDR: 40.0000 mg | DELAYED_RELEASE_CAPSULE | Freq: Two times a day (BID) | ORAL | 3 refills | Status: DC

## 2022-02-23 NOTE — Telephone Encounter (Signed)
Pt states he is having high anxiety in his life right now and he feels it is causing him to have more reflux/heartburn issues. He takes omeprazole '40mg'$  every am but is having issues around 2am or 3am waking him up with epigastric pain and burning in his chest. He also reports issues with constipation. Discussed with him he can take miralax 1-3 doses/day for the constipation. Please advise regarding reflux issues.

## 2022-02-23 NOTE — Telephone Encounter (Signed)
1.  Increase omeprazole to 40 mg twice daily 2.  Schedule abdominal ultrasound 3.  MiraLAX for constipation.  He can take daily.  Adjust dosage to achieve desired effect 4.  Have him see me in the office in 2 weeks Thanks, JP

## 2022-02-23 NOTE — Telephone Encounter (Signed)
Spoke with pt and he is aware of recommendations per Dr. Henrene Pastor. Script sent to pharmacy. Order entered for [place] and rad scheduling to contact pt regarding appt. Pt scheduled to see Dr. Henrene Pastor 03/16/22 at 11:40am. Pt aware.

## 2022-02-23 NOTE — Telephone Encounter (Signed)
Inbound call from patient states he has been having constipation for the last week and also heat burn that is causing him pain and discomfort. Please advise.  Thank you

## 2022-03-02 ENCOUNTER — Ambulatory Visit (HOSPITAL_COMMUNITY)
Admission: RE | Admit: 2022-03-02 | Discharge: 2022-03-02 | Disposition: A | Discharge status: Home / Self Care | Payer: Medicare HMO | Source: Ambulatory Visit | Attending: Internal Medicine | Admitting: Internal Medicine

## 2022-03-02 DIAGNOSIS — R1013 Epigastric pain: Secondary | ICD-10-CM | POA: Insufficient documentation

## 2022-03-16 ENCOUNTER — Ambulatory Visit: Payer: Medicare HMO | Admitting: Internal Medicine

## 2022-03-16 ENCOUNTER — Encounter: Payer: Self-pay | Admitting: Internal Medicine

## 2022-03-16 VITALS — BP 120/68 | HR 85 | Ht 68.0 in | Wt 137.0 lb

## 2022-03-16 DIAGNOSIS — Z86010 Personal history of colonic polyps: Secondary | ICD-10-CM | POA: Diagnosis not present

## 2022-03-16 DIAGNOSIS — K219 Gastro-esophageal reflux disease without esophagitis: Secondary | ICD-10-CM

## 2022-03-16 DIAGNOSIS — K5900 Constipation, unspecified: Secondary | ICD-10-CM

## 2022-03-16 MED ORDER — PLENVU 140 G PO SOLR: 1.0000 | Freq: Once | ORAL | 0 refills | Status: AC

## 2022-03-16 NOTE — Patient Instructions (Signed)
_______________________________________________________  If your blood pressure at your visit was 140/90 or greater, please contact your primary care physician to follow up on this.  _______________________________________________________  If you are age 76 or older, your body mass index should be between 23-30. Your Body mass index is 20.83 kg/m. If this is out of the aforementioned range listed, please consider follow up with your Primary Care Provider.  If you are age 18 or younger, your body mass index should be between 19-25. Your Body mass index is 20.83 kg/m. If this is out of the aformentioned range listed, please consider follow up with your Primary Care Provider.   ________________________________________________________  The [facility] providers would like to encourage you to use [facility] to communicate with providers for non-urgent requests or questions.  Due to long hold times on the telephone, sending your provider a message by [facility] may be a faster and more efficient way to get a response.  Please allow 48 business hours for a response.  Please remember that this is for non-urgent requests.  _______________________________________________________  Bryce Bond have been scheduled for an endoscopy and colonoscopy. Please follow the written instructions given to you at your visit today. Please pick up your prep supplies at the pharmacy within the next 1-3 days. If you use inhalers (even only as needed), please bring them with you on the day of your procedure.

## 2022-03-16 NOTE — Progress Notes (Signed)
HISTORY OF PRESENT ILLNESS:  Bryce Bond is a 77 y.o. male with past medical history as listed below who presented here most recently October 29, 2021 to reestablish GI care.  Principal GI problems or history of multiple advanced adenomatous colon polyps as well as chronic GERD.  See that dictation.  At the time of his last evaluation his reflux symptoms were controlled with omeprazole 20 mg daily.  No other issues.  Patient did well until around February 23, 2022 when he contacted the office with a 1 week history of epigastric discomfort with bad nocturnal reflux despite PPI and new onset constipation.  I increased his omeprazole to 40 mg daily, and scheduled an abdominal ultrasound.  This was set up and performed March 02, 2022.  Examination was unremarkable.  No gallstones.  He was also advised to take MiraLAX for constipation.  This follow-up office appointment made.  Patient tells me that he has had better control of his reflux symptoms with adjusted dose omeprazole.  Still with some breakthrough.  No dysphagia.  MiraLAX does address constipation.  He does not feel he is quite back to baseline.  His last colonoscopy was performed 2017.  He is due for follow-up at this time (he actually has his appointment scheduled for April).  GI review of systems is otherwise negative  REVIEW OF SYSTEMS:  All non-GI ROS negative unless otherwise stated in the HPI except for anxiety/depression, allergies, hearing problems, excessive urination, urinary frequency  Past Medical History:  Diagnosis Date   Allergy    Colon polyps    Diverticulosis    Erosive esophagitis    Esophageal stricture    GERD (gastroesophageal reflux disease)    Hyperlipidemia    Irritable bowel syndrome     Past Surgical History:  Procedure Laterality Date   HERNIA REPAIR     TONSILLECTOMY  01/20/1963    Social History [employer]  reports that he has never smoked. He has never used smokeless tobacco. He reports  current alcohol use. He reports that he does not use drugs.  family history includes Asthma in his daughter; Coronary artery disease in his sister and sister; Diabetes in his father; Gallbladder disease in his father and sister; Heart attack in his father.  No Known Allergies     PHYSICAL EXAMINATION: Vital signs: BP 120/68   Pulse 85   Ht '5\' 8"'$  (1.727 m)   Wt 137 lb (62.1 kg)   SpO2 98%   BMI 20.83 kg/m   Constitutional: generally well-appearing, no acute distress Psychiatric: alert and oriented x3, cooperative Eyes: extraocular movements intact, anicteric, conjunctiva pink Mouth: oral pharynx moist, no lesions Neck: supple no lymphadenopathy Cardiovascular: heart regular rate and rhythm, no murmur Lungs: clear to auscultation bilaterally Abdomen: soft, nontender, nondistended, no obvious ascites, no peritoneal signs, normal bowel sounds, no organomegaly Rectal: Deferred to colonoscopy Extremities: no clubbing, cyanosis, or lower extremity edema bilaterally Skin: no lesions on visible extremities Neuro: No focal deficits.  Cranial nerves intact  ASSESSMENT:  1.  GERD.  Significant symptoms despite omeprazole.  Improved incompletely with adjusted dose PPI. 2.  History of multiple advanced adenomatous colon polyps.  Due for surveillance 3.  New onset constipation.  MiraLAX helps   PLAN:  1.  Reflux precautions 2.  Continue omeprazole 40 mg daily.  Prescribed.  Medication risk reviewed 3.  Schedule upper endoscopy to evaluate significant reflux symptoms despite PPI and epigastric discomfort.The nature of the procedure, as well as the risks, benefits,  and alternatives were carefully and thoroughly reviewed with the patient. Ample time for discussion and questions allowed. The patient understood, was satisfied, and agreed to proceed. 4.  Schedule surveillance colonoscopy as he is due.  Also evaluate change in bowel habits.The nature of the procedure, as well as the risks,  benefits, and alternatives were carefully and thoroughly reviewed with the patient. Ample time for discussion and questions allowed. The patient understood, was satisfied, and agreed to proceed. 5.  Continue MiraLAX as needed

## 2022-04-13 ENCOUNTER — Encounter: Payer: Self-pay | Admitting: Internal Medicine

## 2022-05-06 ENCOUNTER — Encounter: Payer: Medicare HMO | Admitting: Internal Medicine

## 2022-05-06 ENCOUNTER — Ambulatory Visit (AMBULATORY_SURGERY_CENTER): Payer: Medicare HMO | Admitting: Internal Medicine

## 2022-05-06 ENCOUNTER — Encounter: Payer: Self-pay | Admitting: Internal Medicine

## 2022-05-06 VITALS — BP 116/58 | HR 50 | Temp 97.1°F | Resp 12 | Ht 68.0 in | Wt 137.0 lb

## 2022-05-06 DIAGNOSIS — Z09 Encounter for follow-up examination after completed treatment for conditions other than malignant neoplasm: Secondary | ICD-10-CM

## 2022-05-06 DIAGNOSIS — Z86010 Personal history of colonic polyps: Secondary | ICD-10-CM | POA: Diagnosis not present

## 2022-05-06 DIAGNOSIS — K219 Gastro-esophageal reflux disease without esophagitis: Secondary | ICD-10-CM | POA: Diagnosis not present

## 2022-05-06 MED ORDER — SODIUM CHLORIDE 0.9 % IV SOLN
500.0000 mL | Freq: Once | INTRAVENOUS | Status: DC
Start: 2022-05-06 — End: 2022-05-06

## 2022-05-06 NOTE — Progress Notes (Signed)
Sedate, gd SR, tolerated procedure well, VSS, report to RN 

## 2022-05-06 NOTE — Op Note (Signed)
[facility] Patient Name: Bryce Bond Procedure Date: 05/06/2022 10:20 AM MRN: [id number] Endoscopist: Wilhemina Bonito. [name] , MD, [id number] Age: 77 Referring MD:  Date of Birth: 1945/03/07 Gender: Male Account #: [id number] Procedure:                Upper GI endoscopy Indications:              Esophageal reflux Medicines:                Monitored Anesthesia Care Procedure:                Pre-Anesthesia Assessment:                           - Prior to the procedure, a History and Physical                            was performed, and patient medications and                            allergies were reviewed. The patient's tolerance of                            previous anesthesia was also reviewed. The risks                            and benefits of the procedure and the sedation                            options and risks were discussed with the patient.                            All questions were answered, and informed consent                            was obtained. Prior Anticoagulants: The patient has                            taken no anticoagulant or antiplatelet agents. ASA                            Grade Assessment: II - A patient with mild systemic                            disease. After reviewing the risks and benefits,                            the patient was deemed in satisfactory condition to                            undergo the procedure.                           After obtaining informed consent, the endoscope was  passed under direct vision. Throughout the                            procedure, the patient's blood pressure, pulse, and                            oxygen saturations were monitored continuously. The                            Olympus scope [id number] was introduced through the                            mouth, and advanced to the second part of duodenum.                            The upper GI endoscopy was  accomplished without                            difficulty. The patient tolerated the procedure                            well. Scope In: Scope Out: Findings:                 The esophagus was normal.                           The stomach was normal. Small hiatal hernia.                           The examined duodenum was normal.                           The cardia and gastric fundus were normal on                            retroflexion. Complications:            No immediate complications. Estimated Blood Loss:     Estimated blood loss: none. Impression:               - Normal esophagus.                           - Normal stomach.                           - Normal examined duodenum.                           - No specimens collected. Recommendation:           - Patient has a contact number available for                            emergencies. The signs and symptoms of potential  delayed complications were discussed with the                            patient. Return to normal activities tomorrow.                            Written discharge instructions were provided to the                            patient.                           - Resume previous diet.                           - Continue present medications. Wilhemina Bonito. [name], MD 05/06/2022 10:50:32 AM This report has been signed electronically.

## 2022-05-06 NOTE — Progress Notes (Signed)
HISTORY OF PRESENT ILLNESS:   Bryce Bond is a 77 y.o. male with past medical history as listed below who presented here most recently October 29, 2021 to reestablish GI care.  Principal GI problems or history of multiple advanced adenomatous colon polyps as well as chronic GERD.  See that dictation.  At the time of his last evaluation his reflux symptoms were controlled with omeprazole 20 mg daily.  No other issues.   Patient did well until around February 23, 2022 when he contacted the office with a 1 week history of epigastric discomfort with bad nocturnal reflux despite PPI and new onset constipation.  I increased his omeprazole to 40 mg daily, and scheduled an abdominal ultrasound.  This was set up and performed March 02, 2022.  Examination was unremarkable.  No gallstones.  He was also advised to take MiraLAX for constipation.  This follow-up office appointment made.   Patient tells me that he has had better control of his reflux symptoms with adjusted dose omeprazole.  Still with some breakthrough.  No dysphagia.  MiraLAX does address constipation.  He does not feel he is quite back to baseline.  His last colonoscopy was performed 2017.  He is due for follow-up at this time (he actually has his appointment scheduled for April).  GI review of systems is otherwise negative   REVIEW OF SYSTEMS:   All non-GI ROS negative unless otherwise stated in the HPI except for anxiety/depression, allergies, hearing problems, excessive urination, urinary frequency       Past Medical History:  Diagnosis Date   Allergy     Colon polyps     Diverticulosis     Erosive esophagitis     Esophageal stricture     GERD (gastroesophageal reflux disease)     Hyperlipidemia     Irritable bowel syndrome             Past Surgical History:  Procedure Laterality Date   HERNIA REPAIR       TONSILLECTOMY   01/20/1963      Social History [employer]  reports that he has never smoked. He has never  used smokeless tobacco. He reports current alcohol use. He reports that he does not use drugs.   family history includes Asthma in his daughter; Coronary artery disease in his sister and sister; Diabetes in his father; Gallbladder disease in his father and sister; Heart attack in his father.   No Known Allergies       PHYSICAL EXAMINATION: Vital signs: BP 120/68   Pulse 85   Ht  (1.727 m)   Wt 137 lb (62.1 kg)   SpO2 98%   BMI 20.83 kg/m   Constitutional: generally well-appearing, no acute distress Psychiatric: alert and oriented x3, cooperative Eyes: extraocular movements intact, anicteric, conjunctiva pink Mouth: oral pharynx moist, no lesions Neck: supple no lymphadenopathy Cardiovascular: heart regular rate and rhythm, no murmur Lungs: clear to auscultation bilaterally Abdomen: soft, nontender, nondistended, no obvious ascites, no peritoneal signs, normal bowel sounds, no organomegaly Rectal: Deferred to colonoscopy Extremities: no clubbing, cyanosis, or lower extremity edema bilaterally Skin: no lesions on visible extremities Neuro: No focal deficits.  Cranial nerves intact   ASSESSMENT:   1.  GERD.  Significant symptoms despite omeprazole.  Improved incompletely with adjusted dose PPI. 2.  History of multiple advanced adenomatous colon polyps.  Due for surveillance 3.  New onset constipation.  MiraLAX helps     PLAN:   1.  Reflux  precautions 2.  Continue omeprazole 40 mg daily.  Prescribed.  Medication risk reviewed 3.  Schedule upper endoscopy to evaluate significant reflux symptoms despite PPI and epigastric discomfort.The nature of the procedure, as well as the risks, benefits, and alternatives were carefully and thoroughly reviewed with the patient. Ample time for discussion and questions allowed. The patient understood, was satisfied, and agreed to proceed. 4.  Schedule surveillance colonoscopy as he is due.  Also evaluate change in bowel habits.The nature of  the procedure, as well as the risks, benefits, and alternatives were carefully and thoroughly reviewed with the patient. Ample time for discussion and questions allowed. The patient understood, was satisfied, and agreed to proceed. 5.  Continue MiraLAX as needed  Recent history and physical as above.  No interval change in clinical status or exam.  Now for procedures.

## 2022-05-06 NOTE — Op Note (Signed)
[facility] Patient Name: Bryce Bond Procedure Date: 05/06/2022 10:21 AM MRN: [id number] Endoscopist: Wilhemina Bonito. [name] , MD, [id number] Age: 77 Referring MD:  Date of Birth: August 25, 1945 Gender: Male Account #: [id number] Procedure:                Colonoscopy Indications:              High risk colon cancer surveillance: Personal                            history of adenoma (10 mm or greater in size), High                            risk colon cancer surveillance: Personal history of                            multiple (3 or more) adenomas. Previous                            examinations 2001, 2004, 2009, 2014, 2019. Recent                            complaints of constipation Medicines:                Monitored Anesthesia Care Procedure:                Pre-Anesthesia Assessment:                           - Prior to the procedure, a History and Physical                            was performed, and patient medications and                            allergies were reviewed. The patient's tolerance of                            previous anesthesia was also reviewed. The risks                            and benefits of the procedure and the sedation                            options and risks were discussed with the patient.                            All questions were answered, and informed consent                            was obtained. Prior Anticoagulants: The patient has                            taken no anticoagulant or antiplatelet agents.  After reviewing the risks and benefits, the patient                            was deemed in satisfactory condition to undergo the                            procedure.                           After obtaining informed consent, the colonoscope                            was passed under direct vision. Throughout the                            procedure, the patient's blood pressure, pulse, and                             oxygen saturations were monitored continuously. The                            Olympus CF-HQ190L [id number]) Colonoscope was                            introduced through the anus and advanced to the the                            cecum, identified by appendiceal orifice and                            ileocecal valve. The ileocecal valve, appendiceal                            orifice, and rectum were photographed. The quality                            of the bowel preparation was excellent. The                            colonoscopy was performed without difficulty. The                            patient tolerated the procedure well. The bowel                            preparation used was SUPREP via split dose                            instruction. Scope In: 10:25:53 AM Scope Out: 10:37:36 AM Scope Withdrawal Time: 0 hours 9 minutes 8 seconds  Total Procedure Duration: 0 hours 11 minutes 43 seconds  Findings:                 Multiple diverticula were found in the left colon.  Internal hemorrhoids were found during retroflexion.                           The exam was otherwise without abnormality on                            direct and retroflexion views. Complications:            No immediate complications. Estimated blood loss:                            None. Estimated Blood Loss:     Estimated blood loss: none. Impression:               - Diverticulosis in the left colon.                           - Internal hemorrhoids.                           - The examination was otherwise normal on direct                            and retroflexion views.                           - No specimens collected. Recommendation:           - Repeat colonoscopy is not recommended for                            surveillance.                           - Patient has a contact number available for                            emergencies. The signs and symptoms  of potential                            delayed complications were discussed with the                            patient. Return to normal activities tomorrow.                            Written discharge instructions were provided to the                            patient.                           - Resume previous diet.                           - Continue present medications.                           -  MiraLAX as needed for constipation Wilhemina Bonito. [name], MD 05/06/2022 10:42:44 AM This report has been signed electronically.

## 2022-05-06 NOTE — Patient Instructions (Addendum)
-  Handout on diverticulosis and hemorrhoids provided -repeat colonoscopy not recommended for surveillance recommended.  -Continue present medications  -Miralax as needed for constipation    YOU HAD AN ENDOSCOPIC PROCEDURE TODAY AT THE [facility]:   Refer to the procedure report that was given to you for any specific questions about what was found during the examination.  If the procedure report does not answer your questions, please call your gastroenterologist to clarify.  If you requested that your care partner not be given the details of your procedure findings, then the procedure report has been included in a sealed envelope for you to review at your convenience later.  YOU SHOULD EXPECT: Some feelings of bloating in the abdomen. Passage of more gas than usual.  Walking can help get rid of the air that was put into your GI tract during the procedure and reduce the bloating. If you had a lower endoscopy (such as a colonoscopy or flexible sigmoidoscopy) you may notice spotting of blood in your stool or on the toilet paper. If you underwent a bowel prep for your procedure, you may not have a normal bowel movement for a few days.  Please Note:  You might notice some irritation and congestion in your nose or some drainage.  This is from the oxygen used during your procedure.  There is no need for concern and it should clear up in a day or so.  SYMPTOMS TO REPORT IMMEDIATELY:  Following lower endoscopy (colonoscopy or flexible sigmoidoscopy):  Excessive amounts of blood in the stool  Significant tenderness or worsening of abdominal pains  Swelling of the abdomen that is new, acute  Fever of 100F or higher  Following upper endoscopy (EGD)  Vomiting of blood or coffee ground material  New chest pain or pain under the shoulder blades  Painful or persistently difficult swallowing  New shortness of breath  Fever of 100F or higher  Black, tarry-looking stools  For urgent or  emergent issues, a gastroenterologist can be reached at any hour by calling [phone]. Do not use MyChart messaging for urgent concerns.    DIET:  We do recommend a small meal at first, but then you may proceed to your regular diet.  Drink plenty of fluids but you should avoid alcoholic beverages for 24 hours.  ACTIVITY:  You should plan to take it easy for the rest of today and you should NOT DRIVE or use heavy machinery until tomorrow (because of the sedation medicines used during the test).    FOLLOW UP: Our staff will call the number listed on your records the next business day following your procedure.  We will call around 7:15- 8:00 am to check on you and address any questions or concerns that you may have regarding the information given to you following your procedure. If we do not reach you, we will leave a message.     If any biopsies were taken you will be contacted by phone or by letter within the next 1-3 weeks.  Please call us at [phone] if you have not heard about the biopsies in 3 weeks.    SIGNATURES/CONFIDENTIALITY: You and/or your care partner have signed paperwork which will be entered into your electronic medical record.  These signatures attest to the fact that that the information above on your After Visit Summary has been reviewed and is understood.  Full responsibility of the confidentiality of this discharge information lies with you and/or your care-partner.

## 2022-05-07 ENCOUNTER — Telehealth: Payer: Self-pay | Admitting: *Deleted

## 2022-05-07 NOTE — Telephone Encounter (Signed)
  Follow up Call-     05/06/2022    9:29 AM  Call back number  Post procedure Call Back phone  # [phone]  Permission to leave phone message Yes    Post procedure follow up phone call. No answer at number given.  Left message on voicemail.

## 2022-06-12 ENCOUNTER — Encounter: Payer: Self-pay | Admitting: Internal Medicine

## 2022-06-21 ENCOUNTER — Other Ambulatory Visit: Payer: Self-pay | Admitting: Internal Medicine

## 2022-09-11 DIAGNOSIS — I6389 Other cerebral infarction: Secondary | ICD-10-CM | POA: Diagnosis not present

## 2022-09-18 ENCOUNTER — Telehealth: Payer: Self-pay | Admitting: *Deleted

## 2022-12-06 ENCOUNTER — Encounter: Payer: Self-pay | Admitting: Internal Medicine

## 2023-11-19 ENCOUNTER — Ambulatory Visit: Admitting: Podiatry

## 2023-11-19 DIAGNOSIS — M79674 Pain in right toe(s): Secondary | ICD-10-CM | POA: Diagnosis not present

## 2023-11-19 DIAGNOSIS — B351 Tinea unguium: Secondary | ICD-10-CM

## 2023-11-19 DIAGNOSIS — M79675 Pain in left toe(s): Secondary | ICD-10-CM | POA: Diagnosis not present

## 2023-11-19 NOTE — Progress Notes (Signed)
       Subjective:  Patient ID: Bryce Bond, male    DOB: May 03, 1945,  MRN: [id number]  Bryce Bond presents to clinic today for:  Chief Complaint  Patient presents with   Ingrown Toenail    Bilateral hallux, bilateral nail borders. Not infected, but would rater make sure they are trimmed properly, rather then the procedure.  Not diabetic and Plavix   Patient presents wanting to make sure he is trimming his own nails appropriately.  He denies any pain.  He is concerned that he may be getting ingrown nails on the great toenails in the corners.  Denies any drainage.  He does not want any type of surgical procedure performed today to the toenails.  PCP is Bryce Bond, Bryce Bond, Bryce Bond.  Past Medical History:  Diagnosis Date   Allergy    Anxiety    Colon polyps    Diverticulosis    Erosive esophagitis    Esophageal stricture    GERD (gastroesophageal reflux disease)    Hyperlipidemia    Irritable bowel syndrome    Past Surgical History:  Procedure Laterality Date   HERNIA REPAIR     TONSILLECTOMY  01/20/1963   No Known Allergies  Review of Systems: Negative except as noted in the HPI.  Objective:  Bryce Bond is a pleasant 78 y.o. male in NAD. AAO x 3.  Vascular Examination: Capillary refill time is 3-5 seconds to toes bilateral. Palpable pedal pulses b/l LE. Digital hair present b/l.  Skin temperature gradient WNL b/l. No varicosities b/l. No cyanosis noted b/l.   Dermatological Examination: Pedal skin with normal turgor, texture and tone b/l. No open wounds. No interdigital macerations b/l. Toenails x10 are minimally dystrophic with minimal to no subungual debris. They are elongated x10  Assessment/Plan: 1. Pain due to onychomycosis of toenails of both feet    The toenails were sharply debrided x10 with sterile nail nippers and a power debriding burr to decrease bulk/thickness and length.  The hallux corners were rounded off.  Informed the patient that he is doing a  great job trimming his own nails.  He will continue to perform his own nail care and follow-up as needed.  Follow-up as needed   Bryce Bond Bryce Bond, DPM, FACFAS [facility]     [address] [place], [place] [address]                Office [phone]  Fax [phone]

## 2023-11-22 ENCOUNTER — Encounter: Payer: Self-pay | Admitting: Podiatry

## 2024-03-03 ENCOUNTER — Ambulatory Visit: Admitting: Internal Medicine
